# Patient Record
Sex: Male | Born: 1985 | Race: Black or African American | Hispanic: No | Marital: Single | Smoking: Former smoker
Health system: Southern US, Community
[De-identification: ages and names within clinical notes are randomized; demographics above are authoritative.]

## PROBLEM LIST (undated history)

## (undated) DIAGNOSIS — G459 Transient cerebral ischemic attack, unspecified: Secondary | ICD-10-CM

## (undated) DIAGNOSIS — I456 Pre-excitation syndrome: Secondary | ICD-10-CM

## (undated) DIAGNOSIS — I480 Paroxysmal atrial fibrillation: Secondary | ICD-10-CM

## (undated) HISTORY — DX: Pre-excitation syndrome: I45.6

## (undated) HISTORY — PX: CARDIAC SURGERY: SHX584

## (undated) HISTORY — DX: Paroxysmal atrial fibrillation: I48.0

## (undated) HISTORY — DX: Transient cerebral ischemic attack, unspecified: G45.9

---
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
## 2006-08-28 ENCOUNTER — Emergency Department (HOSPITAL_COMMUNITY): Admission: EM | Admit: 2006-08-28 | Discharge: 2006-08-28 | Payer: Self-pay | Admitting: Emergency Medicine

## 2006-09-10 ENCOUNTER — Emergency Department (HOSPITAL_COMMUNITY): Admission: EM | Admit: 2006-09-10 | Discharge: 2006-09-10 | Payer: Self-pay | Admitting: Emergency Medicine

## 2007-09-25 ENCOUNTER — Emergency Department (HOSPITAL_COMMUNITY): Admission: EM | Admit: 2007-09-25 | Discharge: 2007-09-25 | Payer: Self-pay | Admitting: Emergency Medicine

## 2008-03-31 ENCOUNTER — Emergency Department (HOSPITAL_COMMUNITY): Admission: EM | Admit: 2008-03-31 | Discharge: 2008-04-01 | Payer: Self-pay | Admitting: Emergency Medicine

## 2008-07-24 HISTORY — PX: CARDIAC SURGERY: SHX584

## 2009-03-10 ENCOUNTER — Emergency Department (HOSPITAL_COMMUNITY): Admission: EM | Admit: 2009-03-10 | Discharge: 2009-03-10 | Payer: Self-pay | Admitting: Emergency Medicine

## 2009-05-19 ENCOUNTER — Emergency Department (HOSPITAL_COMMUNITY): Admission: EM | Admit: 2009-05-19 | Discharge: 2009-05-20 | Payer: Self-pay | Admitting: Emergency Medicine

## 2009-08-17 ENCOUNTER — Encounter: Payer: Self-pay | Admitting: Internal Medicine

## 2009-08-18 ENCOUNTER — Encounter (INDEPENDENT_AMBULATORY_CARE_PROVIDER_SITE_OTHER): Payer: Self-pay | Admitting: Cardiovascular Disease

## 2009-08-18 ENCOUNTER — Ambulatory Visit: Payer: Self-pay | Admitting: Internal Medicine

## 2009-08-18 ENCOUNTER — Inpatient Hospital Stay (HOSPITAL_COMMUNITY): Admission: EM | Admit: 2009-08-18 | Discharge: 2009-08-24 | Payer: Self-pay | Admitting: Emergency Medicine

## 2009-08-19 ENCOUNTER — Encounter: Payer: Self-pay | Admitting: Internal Medicine

## 2009-09-07 ENCOUNTER — Ambulatory Visit: Payer: Self-pay | Admitting: Internal Medicine

## 2009-09-07 DIAGNOSIS — I059 Rheumatic mitral valve disease, unspecified: Secondary | ICD-10-CM | POA: Insufficient documentation

## 2009-09-07 DIAGNOSIS — I4891 Unspecified atrial fibrillation: Secondary | ICD-10-CM | POA: Insufficient documentation

## 2009-09-08 ENCOUNTER — Emergency Department (HOSPITAL_COMMUNITY): Admission: EM | Admit: 2009-09-08 | Discharge: 2009-09-09 | Payer: Self-pay | Admitting: Emergency Medicine

## 2009-09-17 ENCOUNTER — Telehealth: Payer: Self-pay | Admitting: Internal Medicine

## 2009-09-22 ENCOUNTER — Telehealth (INDEPENDENT_AMBULATORY_CARE_PROVIDER_SITE_OTHER): Payer: Self-pay | Admitting: *Deleted

## 2009-09-27 ENCOUNTER — Telehealth (INDEPENDENT_AMBULATORY_CARE_PROVIDER_SITE_OTHER): Payer: Self-pay | Admitting: *Deleted

## 2009-09-29 ENCOUNTER — Telehealth: Payer: Self-pay | Admitting: Internal Medicine

## 2009-12-01 ENCOUNTER — Encounter (INDEPENDENT_AMBULATORY_CARE_PROVIDER_SITE_OTHER): Payer: Self-pay | Admitting: *Deleted

## 2010-01-10 ENCOUNTER — Telehealth: Payer: Self-pay | Admitting: Internal Medicine

## 2010-02-03 ENCOUNTER — Ambulatory Visit: Payer: Self-pay | Admitting: Internal Medicine

## 2010-03-15 ENCOUNTER — Telehealth: Payer: Self-pay | Admitting: Internal Medicine

## 2010-08-23 NOTE — Progress Notes (Signed)
Summary: mon wants pt to have pacemaker  Phone Note Call from Patient   Caller: Mom [name] [phone] Reason for Call: Talk to Nurse Summary of Call: mom would like dr Graciela Husbands to set up pacemaker placement-[facility] denied him due to not having insurance-med too expensive Initial call taken by: Glynda Jaeger,  January 10, 2010 12:56 PM  Follow-up for Phone Call        Spoke with pt's mother. Mother states pt. was scheduled to have surgery in [place]. Pt's insurance would not cover the surgery . Mother also said the medication were very expensive. Pt's mother would like to make an appointment in Dr. Marikay Alar clinic to discuss pacemaker placement . An appointment was made for pt. on July 14th at 2:45 Pm pt's mother aware. Follow-up by: Ollen Gross, RN, BSN,  January 10, 2010 1:19 PM

## 2010-08-23 NOTE — Progress Notes (Signed)
Summary: regarding pacemaker  Phone Note Call from Patient Call back at Home Phone [phone]   Caller: Mom- saundra Reason for Call: Talk to Nurse Details for Reason: Per pt mom calling, Has Dr. Graciela Husbands agree to do the pacermaker , [facility] has refused b/c insurance reason.  Initial call taken by: Lorne Skeens,  September 29, 2009 10:49 AM  Follow-up for Phone Call        S/W Osvaldo Human and had the same conversation with her as I had with her son yesterday. She understands and is grateful Follow-up by: Duncan Dull, RN, BSN,  September 29, 2009 12:11 PM

## 2010-08-23 NOTE — Progress Notes (Signed)
Summary:  Referral to Dr. Macon Large  Phone Note Other Incoming   Caller: Marlene Lard- Financial Counselor for [facility] Summary of Call: She received a call from Mr. Kroening mother (Mrs. Clydene Pugh) stating the Dr. Macon Large will not see the pt because he is self pay unless he paid upfront which is $450.00. The only thing that she can recommend is maybe having them apply for Medicaid if there is a possibilty that he will be disabled for 12months. I will pass this message on to Dr. Graciela Husbands. She would like for him to s/w the mother to let her know if he has any other suggestions. She is aware that he is out of the office until tomorrow.  Initial call taken by: Duncan Dull, RN, BSN,  September 27, 2009 1:46 PM  Follow-up for Phone Call        Per Dr. Graciela Husbands we will refer him to Dr. Prentice Docker at [facility]- [phone]. I s/w Diane and faxed all of the pts information including a demographic sheet to her. She said they do take self pay pt's. S/W the pt and gave him the information. Attempted to s/w Mother and she was not home. Pt is aware that she can call with questions. Dr. Metro Kung office will contact the pt for an appt.  Follow-up by: Duncan Dull, RN, BSN,  September 28, 2009 3:06 PM

## 2010-08-23 NOTE — Letter (Signed)
Summary: Work Writer, Main Office  [address]   [place], [place] [address]   Phone: [phone]  Fax: [phone]     February 03, 2010    Robert Koch   The above named patient had a medical visit today  Please take this into consideration when reviewing the time away from work/school.      Sincerely yours,  Architectural technologist

## 2010-08-23 NOTE — Progress Notes (Signed)
Summary: appt in [facility]/Mom call to check on appt  Phone Note Call from Patient Call back at Home Phone [phone]   Caller: Mom Reason for Call: Talk to Nurse Summary of Call: wants to know if appt has been made for [facility] Initial call taken by: Migdalia Dk,  September 17, 2009 3:40 PM  Follow-up for Phone Call        Spoke with pt. She would like to know about the status of an appointment that supposed to be made for her at [facility]. I let pt. know  Md and his nurse are not in office today. I will send message to their desktop. Melonie MD's nurse will be in the office on monday 09/20/09. Okay with pt. Ollen Gross, RN, BSN  September 17, 2009 3:54 PM  [phone] Mom calling to f/u on her son's appt to [facility] for heart surgery. She is at the hospital right now with her mother and the number listed above is her brothers number. Please call with appt at the number listed above. Omer Jack  September 20, 2009 11:46 AM    per pt mom calling back to speak with melaine, will not be at home, going to hospital but will call back Lorne Skeens  September 21, 2009 9:18 AM    Additional Follow-up for Phone Call Additional follow up Details #1::        I called and s/w the pt's uncle as requested by pt's mother. I let him know that I was ask by Dr. Graciela Husbands to send a demographic sheet and last OV note to [facility], which I did but normally [facility] and the pt make the arrangements from that point. I also told him that I would certainly s/w Dr. Graciela Husbands tomorrow to make sure that he wasn't  planning to make other arrangements that I may not be aware of. I told him she can call me back if she needs to but I would let her know more after s/w Dr. Graciela Husbands.  Additional Follow-up by: Duncan Dull, RN, BSN,  September 20, 2009 2:01 PM    Additional Follow-up for Phone Call Additional follow up Details #2::    mother calling back, request call [phone], Migdalia Dk  September 20, 2009 4:49 PM   S/W Olegario Messier at Dr.  Mont Dutton office and I am sent her a demographics sheet so that she can get the pt scheduled. Olegario Messier will be contacting them.  Pts mother is a pt here as well her name is Rosana Hoes and she is aware of the plan.  Follow-up by: Duncan Dull, RN, BSN,  September 22, 2009 2:49 PM

## 2010-08-23 NOTE — Progress Notes (Signed)
Summary: Calling regarding surgery  Phone Note Call from Patient Call back at [phone]   Caller: Mom/Sandra Summary of Call: Pt mother calling about the pt surgery Initial call taken by: Judie Grieve,  March 15, 2010 10:38 AM  Follow-up for Phone Call        the pt was started on propafenone 225 two times a day i will ask the team out [facility] about cryo ablation Follow-up by: Nathen May, MD, [facility],  March 15, 2010 5:59 PM

## 2010-08-23 NOTE — Letter (Signed)
Summary: Appointment - Missed   [facility], Main Office  [address]   [place], [place] [address]   Phone: [phone]  Fax: [phone]     Dec 01, 2009 MRN: [id number]   Jden Palmatier [address] [place], [place]  [address]   Dear Mr. Wendling,  Our records indicate you missed your appointment on 5.4 with Dr Graciela Husbands. It is very important that we reach you to reschedule this appointment. We look forward to participating in your health care needs. Please contact us at the number listed above at your earliest convenience to reschedule this appointment.     Sincerely,   Ruel Favors Scheduling Team

## 2010-08-23 NOTE — Progress Notes (Signed)
  Faxed all Cardiac over to [facility] w/ [facility] to fax [phone] [facility]  September 22, 2009 3:38 PM

## 2010-08-23 NOTE — Assessment & Plan Note (Signed)
Summary: appt @ 4:15/eph   CC:  eph/pt reports palpitations with occasional chest pain and SOB.  It sometimes wakes him up at night.  He has a hx of atrial fibrillation.[name]  History of Present Illness: Robert Koch is seen in followup for atrial fibrillation. He underwent a physiologic testing given his young age. 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 Flexion and therapy was recommended. He has had at least one breakthrough on flecainide 50 mg twice daily he is tolerating the medications without difficulty.  hospital echo demonstrated normal left ventricular function with mitral valve prolapse  Current Medications (verified): 1)  Flecainide Acetate 50 Mg Tabs (Flecainide Acetate) .... Take One Tablet Two Times A Day 2)  Aspirin 325 Mg Tabs (Aspirin) .... Take One Tablet Weekly As Needed.  Not Taking  Allergies (verified): 1)  ! Amoxicillin  Vital Signs:  Patient profile:   25 year old male Height:      60 inches Weight:      135 pounds BMI:     26.46 Pulse rate:   82 / minute Pulse rhythm:   irregular BP sitting:   90 / 58  (left arm) Cuff size:   large  Vitals Entered By: Judithe Modest CMA (September 07, 2009 4:04 PM)  Physical Exam  General:  The patient was alert and oriented in no acute distress. HEENT Normal.  Neck veins were flat, carotids were brisk.  Lungs were clear.  Heart sounds were regular without murmurs or gallops.  Abdomen was soft with active bowel sounds. There is no clubbing cyanosis or edema. Skin Warm and dry    EKG  Procedure date:  09/07/2009  Findings:      sinus rhythm at 82 with intervals of 0.10/0.08/0.37 axis was 54 No evidence of ventricular  Impression & Recommendations:  Problem # 1:  ATRIAL FIBRILLATION (ICD-427.31)  He has had recurrent atrial fibrillation ; we will increase his flecainide from 50-100 mg twice daily.  In addition I've been in contact with [facility] to consider catheter ablation using cryotherapy  to try to avoid the possibility of inadvertent heart block. I await hearing from them. His updated medication list for this problem includes:    Flecainide Acetate 50 Mg Tabs (Flecainide acetate) .[name]... Take one tablet two times a day    Aspirin 325 Mg Tabs (Aspirin) .[name]... Take one tablet weekly as needed.  not taking  His updated medication list for this problem includes:    Flecainide Acetate 100 Mg Tabs (Flecainide acetate) .[name]... Take one tablet by mouth every 12 hours    Aspirin 325 Mg Tabs (Aspirin) .[name]... Take one tablet weekly as needed.  not taking  His updated medication list for this problem includes:    Flecainide Acetate 100 Mg Tabs (Flecainide acetate) .[name]... Take one tablet by mouth every 12 hours    Aspirin 325 Mg Tabs (Aspirin) .[name]... Take one tablet weekly as needed.  not taking  Problem # 2:  MITRAL VALVE PROLAPSE (ICD-424.0) hwill need long-term followup  Other Orders: EKG w/ Interpretation (93000)  Patient Instructions: 1)  Your physician recommends that you schedule a follow-up appointment in: 3 MONTHS 2)  Your physician has recommended you make the following change in your medication: INCREASE FLECANIDE 100MG  TWICE DAILY Prescriptions: FLECAINIDE ACETATE 100 MG TABS (FLECAINIDE ACETATE) Take one tablet by mouth every 12 hours  #60 x 12   Entered by:   Deliah Goody, RN   Authorized by:   Nathen May,  MD, [facility]   Signed by:   Deliah Goody, RN on 09/07/2009   Method used:   Electronically to        [facility] [phone]* (retail)       [address]       [place], [place]  [address]       Ph: [phone] or [phone]       Fax: [phone]   RxID:   [id number]

## 2010-08-23 NOTE — Assessment & Plan Note (Signed)
Summary: rov/discuss pacemaker placement/per nividia/jml   CC:  Pt here to discuss device.  He was turned down at [facility] for device.  He has been having problems with chest discomfort and irregular beats.  He has not been taking Flecainide of ASA due to not being able to afford the flecainide.  [name]  History of Present Illness: MMiller is seen in followup for atrial fibrillation. He underwentelectro  physiologic testing given his young age. 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 Flecanide therapy was recommended. He has had at least one breakthrough on flecainide 50 mg twice dailybut then stopped taking it becasue he thoguth it wasnt workihg   has episodes of palpitations that last 10-15 seconds. There are no longer episodes.  He has talked to the doctors at [facility] and his mother has talked to the program at [facility]. Apparently neither hospital is willing to see him for his ablation because of lack of insurance.  hospital echo demonstrated normal left ventricular function with mitral valve prolapse  Current Medications (verified): 1)  None  Allergies (verified): 1)  ! Amoxicillin  Past History:  Past Medical History: Last updated: 11/24/2009 Paroxysmal atrial fibrillation History of recurrent tachy palpitations, both regular and       irregular Left ventricular hypertrophy by echo History of an enlarged heart  Family History: Last updated: 11/24/2009 Notable for his mother with atrial fibrillation.  Apparently, his father is healthy.   Social History: Last updated: 11/24/2009 He is single He smokes and uses alcohol occasionally.   Denies use of recreational drugs.   He does use caffeine.   Vital Signs:  Patient profile:   25 year old male Height:      60 inches Weight:      248 pounds BMI:     48.61 Pulse rate:   69 / minute Pulse rhythm:   regular BP sitting:   110 / 70  (right arm) Cuff size:   regular  Vitals Entered By: Judithe Modest CMA (February 03, 2010 2:59 PM)  Physical Exam  General:  The patient was alert and oriented in no acute distress. HEENT Normal.  Neck veins were flat, carotids were brisk.  Lungs were clear.  Heart sounds were regular without murmurs or gallops.  Abdomen was soft with active bowel sounds. There is no clubbing cyanosis or edema. 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 He has not been able to get ablation undertaken a [facility] or [facility] because of insurance issues. We'll put him on her path and on. To 25 twice daily. He has breakthroughs, I will consider undertaking procedure here with the realization that there may be increased risk related to RF energy versus cryo energy   The following medications were removed from the medication list:    Flecainide Acetate 100 Mg Tabs (Flecainide acetate) .[name]... Take one tablet by mouth every 12 hours    Aspirin 325 Mg Tabs (Aspirin) .[name]... Take one tablet weekly as needed.  not taking  Other Orders: EKG w/ Interpretation (93000)  Patient Instructions: 1)  Your physician has recommended you make the following change in your medication: Start Propafenone 225mg  1 tablet twice daily. Prescriptions: PROPAFENONE HCL 225 MG TABS (PROPAFENONE HCL) Take one tablet  by mouth every 12 hours  #60 x 11   Entered by:   Optometrist BSN   Authorized by:   Nathen May, MD, [facility]   Signed by:   Gypsy Balsam RN BSN on 02/03/2010   Method used:   Electronically to        [facility] [phone]* (retail)       [address]       [place], [place]  [address]       Ph: [phone]       Fax: [phone]   RxID:   [id number]

## 2010-10-05 ENCOUNTER — Emergency Department (HOSPITAL_COMMUNITY)
Admission: EM | Admit: 2010-10-05 | Discharge: 2010-10-05 | Disposition: A | Discharge status: Home / Self Care | Payer: Self-pay | Attending: Emergency Medicine | Admitting: Emergency Medicine

## 2010-10-05 DIAGNOSIS — M545 Low back pain, unspecified: Secondary | ICD-10-CM | POA: Insufficient documentation

## 2010-10-05 DIAGNOSIS — M546 Pain in thoracic spine: Secondary | ICD-10-CM | POA: Insufficient documentation

## 2010-10-05 DIAGNOSIS — S335XXA Sprain of ligaments of lumbar spine, initial encounter: Secondary | ICD-10-CM | POA: Insufficient documentation

## 2010-10-05 DIAGNOSIS — X58XXXA Exposure to other specified factors, initial encounter: Secondary | ICD-10-CM | POA: Insufficient documentation

## 2010-10-05 DIAGNOSIS — J45909 Unspecified asthma, uncomplicated: Secondary | ICD-10-CM | POA: Insufficient documentation

## 2010-10-09 LAB — BASIC METABOLIC PANEL
Calcium: 8.9 mg/dL (ref 8.4–10.5)
GFR calc Af Amer: >60 mL/min (ref >60)
GFR calc non Af Amer: >60 mL/min (ref >60)
Glucose, Bld: 97 mg/dL (ref 70–99)
Sodium: 138 mEq/L (ref 135–145)

## 2010-10-09 LAB — COMPREHENSIVE METABOLIC PANEL WITH GFR
ALT: 15 U/L (ref 0–53)
AST: 21 U/L (ref 0–37)
Albumin: 3.8 g/dL (ref 3.5–5.2)
Alkaline Phosphatase: 108 U/L (ref 39–117)
BUN: 28 mg/dL — ABNORMAL HIGH (ref 6–23)
CO2: 26 meq/L (ref 19–32)
Calcium: 9.6 mg/dL (ref 8.4–10.5)
Chloride: 105 meq/L (ref 96–112)
Creatinine, Ser: 1.11 mg/dL (ref 0.4–1.5)
GFR calc non Af Amer: >60 mL/min
Glucose, Bld: 82 mg/dL (ref 70–99)
Potassium: 3.9 meq/L (ref 3.5–5.1)
Sodium: 138 meq/L (ref 135–145)
Total Bilirubin: 0.5 mg/dL (ref 0.3–1.2)
Total Protein: 7.6 g/dL (ref 6.0–8.3)

## 2010-10-09 LAB — DIFFERENTIAL
Basophils Absolute: 0 10*3/uL (ref 0.0–0.1)
Basophils Relative: 0 % (ref 0–1)
Eosinophils Absolute: 0.2 10*3/uL (ref 0.0–0.7)
Eosinophils Relative: 4 % (ref 0–5)
Lymphocytes Relative: 33 % (ref 12–46)
Lymphs Abs: 1.9 10*3/uL (ref 0.7–4.0)
Monocytes Absolute: 0.6 10*3/uL (ref 0.1–1.0)
Monocytes Relative: 11 % (ref 3–12)
Neutro Abs: 2.9 10*3/uL (ref 1.7–7.7)
Neutrophils Relative %: 51 % (ref 43–77)

## 2010-10-09 LAB — CK TOTAL AND CKMB (NOT AT ARMC): CK, MB: 1.5 ng/mL (ref 0.3–4.0)

## 2010-10-09 LAB — CARDIAC PANEL(CRET KIN+CKTOT+MB+TROPI)
CK, MB: 0.9 ng/mL (ref 0.3–4.0)
CK, MB: 1.2 ng/mL (ref 0.3–4.0)
Total CK: 127 U/L (ref 7–232)
Troponin I: 0.01 ng/mL (ref 0.00–0.06)

## 2010-10-09 LAB — CBC
HCT: 39.9 % (ref 39.0–52.0)
HCT: 44.1 % (ref 39.0–52.0)
Hemoglobin: 12.7 g/dL — ABNORMAL LOW (ref 13.0–17.0)
Hemoglobin: 14.6 g/dL (ref 13.0–17.0)
MCHC: 33.9 g/dL (ref 30.0–36.0)
MCV: 83.9 fL (ref 78.0–100.0)
Platelets: 145 10*3/uL — ABNORMAL LOW (ref 150–400)
Platelets: 165 10*3/uL (ref 150–400)
RBC: 4.76 MIL/uL (ref 4.22–5.81)
RDW: 12.9 % (ref 11.5–15.5)
RDW: 13.5 % (ref 11.5–15.5)
WBC: 4.9 10*3/uL (ref 4.0–10.5)

## 2010-10-09 LAB — MAGNESIUM: Magnesium: 2.2 mg/dL (ref 1.5–2.5)

## 2010-10-09 LAB — BRAIN NATRIURETIC PEPTIDE: Pro B Natriuretic peptide (BNP): <30 pg/mL (ref 0.0–100.0)

## 2010-10-09 LAB — HEMOGLOBIN A1C
Hgb A1c MFr Bld: 5.4 % (ref 4.6–6.1)
Mean Plasma Glucose: 108 mg/dL

## 2010-10-09 LAB — PROTIME-INR
INR: 1.12 (ref 0.00–1.49)
Prothrombin Time: 13 seconds (ref 11.6–15.2)

## 2010-10-09 LAB — DRUGS OF ABUSE SCREEN W/O ALC, ROUTINE URINE
Benzodiazepines.: NEGATIVE
Creatinine,U: 70 mg/dL
Marijuana Metabolite: NEGATIVE
Phencyclidine (PCP): NEGATIVE
Propoxyphene: NEGATIVE

## 2010-10-09 LAB — POCT CARDIAC MARKERS: Troponin i, poc: <0.05 ng/mL (ref 0.00–0.09)

## 2010-10-09 LAB — ETHANOL: Alcohol, Ethyl (B): <5 mg/dL (ref 0–10)

## 2010-10-09 LAB — TSH: TSH: 1.216 u[IU]/mL (ref 0.350–4.500)

## 2010-10-09 LAB — TROPONIN I: Troponin I: 0.02 ng/mL (ref 0.00–0.06)

## 2010-10-09 LAB — APTT: aPTT: 30 seconds (ref 24–37)

## 2010-10-29 LAB — POCT I-STAT, CHEM 8
BUN: 13 mg/dL (ref 6–23)
Calcium, Ion: 1.14 mmol/L (ref 1.12–1.32)
Chloride: 105 mEq/L (ref 96–112)
Creatinine, Ser: 1.1 mg/dL (ref 0.4–1.5)
Glucose, Bld: 88 mg/dL (ref 70–99)

## 2010-10-29 LAB — CBC
Hemoglobin: 14.5 g/dL (ref 13.0–17.0)
MCHC: 33.6 g/dL (ref 30.0–36.0)
RBC: 5.19 MIL/uL (ref 4.22–5.81)

## 2010-10-29 LAB — DIFFERENTIAL
Lymphocytes Relative: 26 % (ref 12–46)
Lymphs Abs: 1.1 10*3/uL (ref 0.7–4.0)
Monocytes Absolute: 0.4 10*3/uL (ref 0.1–1.0)
Monocytes Relative: 10 % (ref 3–12)
Neutro Abs: 2.7 10*3/uL (ref 1.7–7.7)

## 2010-10-29 LAB — POCT CARDIAC MARKERS: Troponin i, poc: <0.05 ng/mL (ref 0.00–0.09)

## 2010-12-12 ENCOUNTER — Emergency Department (HOSPITAL_COMMUNITY)
Admission: EM | Admit: 2010-12-12 | Discharge: 2010-12-13 | Disposition: A | Discharge status: Home / Self Care | Payer: Self-pay | Attending: Emergency Medicine | Admitting: Emergency Medicine

## 2010-12-12 DIAGNOSIS — R109 Unspecified abdominal pain: Secondary | ICD-10-CM | POA: Insufficient documentation

## 2010-12-12 DIAGNOSIS — R112 Nausea with vomiting, unspecified: Secondary | ICD-10-CM | POA: Insufficient documentation

## 2010-12-13 LAB — COMPREHENSIVE METABOLIC PANEL
ALT: 14 U/L (ref 0–53)
Albumin: 4.1 g/dL (ref 3.5–5.2)
Alkaline Phosphatase: 65 U/L (ref 39–117)
BUN: 13 mg/dL (ref 6–23)
Chloride: 93 mEq/L — ABNORMAL LOW (ref 96–112)
Glucose, Bld: 136 mg/dL — ABNORMAL HIGH (ref 70–99)
Potassium: 3.6 mEq/L (ref 3.5–5.1)
Sodium: 131 mEq/L — ABNORMAL LOW (ref 135–145)
Total Bilirubin: 0.3 mg/dL (ref 0.3–1.2)

## 2010-12-13 LAB — URINALYSIS, ROUTINE W REFLEX MICROSCOPIC
Bilirubin Urine: NEGATIVE
Glucose, UA: NEGATIVE mg/dL
Ketones, ur: 40 mg/dL — AB
Protein, ur: NEGATIVE mg/dL

## 2010-12-13 LAB — CBC
HCT: 43.5 % (ref 39.0–52.0)
MCV: 80.1 fL (ref 78.0–100.0)
Platelets: 205 10*3/uL (ref 150–400)
RBC: 5.43 MIL/uL (ref 4.22–5.81)
WBC: 6.1 10*3/uL (ref 4.0–10.5)

## 2010-12-13 LAB — DIFFERENTIAL
Basophils Absolute: 0 10*3/uL (ref 0.0–0.1)
Lymphocytes Relative: 18 % (ref 12–46)
Lymphs Abs: 1.1 10*3/uL (ref 0.7–4.0)
Neutrophils Relative %: 69 % (ref 43–77)

## 2011-04-26 LAB — POCT I-STAT, CHEM 8
BUN: 18
Calcium, Ion: 1.24
Chloride: 102
Creatinine, Ser: 1.2
Glucose, Bld: 85
HCT: 41
Potassium: 3.7

## 2011-04-26 LAB — POCT CARDIAC MARKERS: Troponin i, poc: <0.05

## 2011-07-31 IMAGING — CR DG CHEST 1V PORT
1 series · 1 of 1 positions shown · non-contrast
Comparison: 03/10/2009

CLINICAL DATA: Tachycardia.

CHEST - 1 VIEW

[AP]
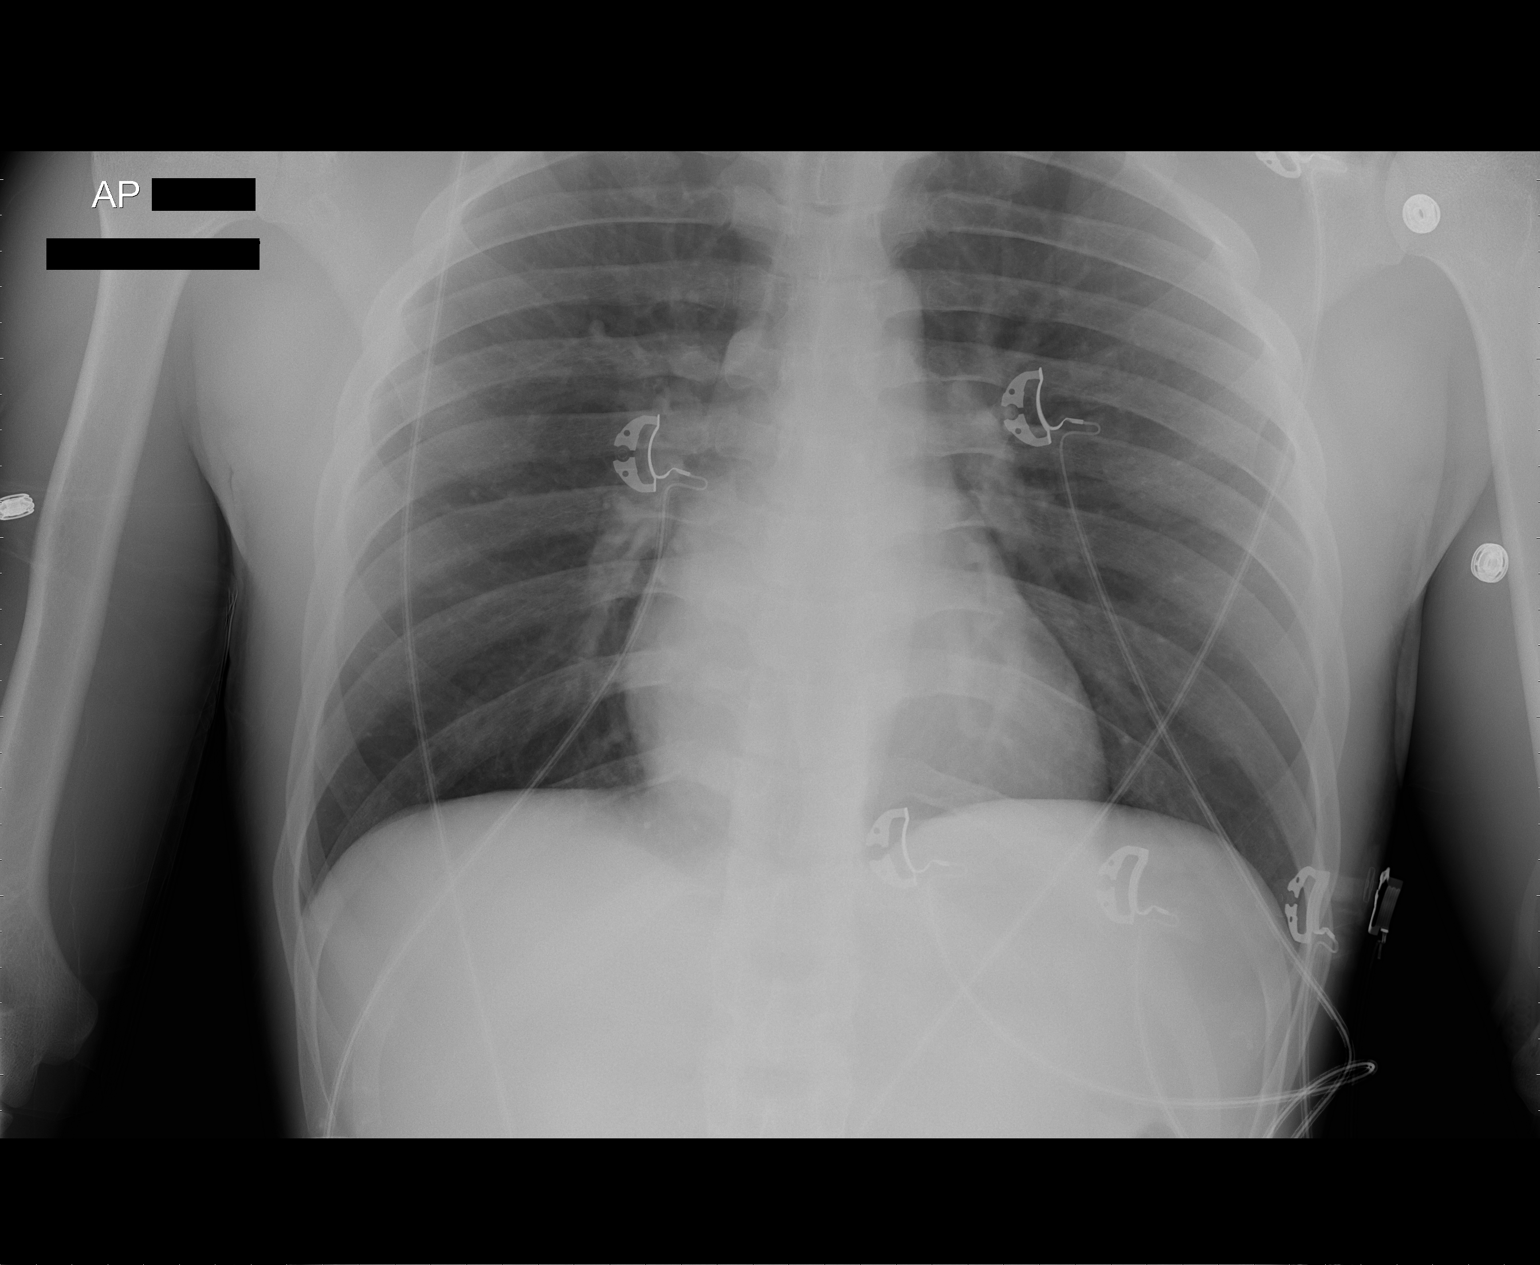

[1 of 1 positions shown; findings below may reference images not displayed]

FINDINGS: The heart size and mediastinal contours are within normal
limits.  Both lungs are clear.
IMPRESSION: No active disease.

## 2012-05-12 ENCOUNTER — Encounter (HOSPITAL_COMMUNITY): Payer: Self-pay | Admitting: *Deleted

## 2012-05-12 ENCOUNTER — Emergency Department (HOSPITAL_COMMUNITY): Payer: No Typology Code available for payment source

## 2012-05-12 ENCOUNTER — Emergency Department (HOSPITAL_COMMUNITY)
Admission: EM | Admit: 2012-05-12 | Discharge: 2012-05-12 | Disposition: A | Discharge status: Home / Self Care | Payer: No Typology Code available for payment source | Attending: Emergency Medicine | Admitting: Emergency Medicine

## 2012-05-12 DIAGNOSIS — S139XXA Sprain of joints and ligaments of unspecified parts of neck, initial encounter: Secondary | ICD-10-CM | POA: Insufficient documentation

## 2012-05-12 DIAGNOSIS — Y92410 Unspecified street and highway as the place of occurrence of the external cause: Secondary | ICD-10-CM | POA: Insufficient documentation

## 2012-05-12 DIAGNOSIS — S161XXA Strain of muscle, fascia and tendon at neck level, initial encounter: Secondary | ICD-10-CM

## 2012-05-12 MED ORDER — HYDROCODONE-ACETAMINOPHEN 5-325 MG PO TABS: 1.0000 | ORAL_TABLET | Freq: Four times a day (QID) | ORAL | Status: DC | PRN

## 2012-05-12 MED ORDER — IBUPROFEN 800 MG PO TABS: 800.0000 mg | ORAL_TABLET | Freq: Three times a day (TID) | ORAL | Status: DC | PRN

## 2012-05-12 NOTE — ED Notes (Signed)
Patient transported to X-ray 
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
## 2012-05-17 NOTE — ED Provider Notes (Signed)
Medical screening examination/treatment/procedure(s) were performed by non-physician practitioner and as supervising physician I was immediately available for consultation/collaboration.   Keron Neenan M Tameah Mihalko, MD 05/17/12 2122 

## 2013-03-14 ENCOUNTER — Emergency Department (HOSPITAL_COMMUNITY): Payer: Self-pay

## 2013-03-14 ENCOUNTER — Encounter (HOSPITAL_COMMUNITY): Payer: Self-pay

## 2013-03-14 ENCOUNTER — Emergency Department (HOSPITAL_COMMUNITY)
Admission: EM | Admit: 2013-03-14 | Discharge: 2013-03-14 | Disposition: A | Discharge status: Home / Self Care | Payer: Self-pay | Attending: Emergency Medicine | Admitting: Emergency Medicine

## 2013-03-14 DIAGNOSIS — M549 Dorsalgia, unspecified: Secondary | ICD-10-CM

## 2013-03-14 DIAGNOSIS — R079 Chest pain, unspecified: Secondary | ICD-10-CM | POA: Insufficient documentation

## 2013-03-14 DIAGNOSIS — K089 Disorder of teeth and supporting structures, unspecified: Secondary | ICD-10-CM | POA: Insufficient documentation

## 2013-03-14 DIAGNOSIS — G8918 Other acute postprocedural pain: Secondary | ICD-10-CM | POA: Insufficient documentation

## 2013-03-14 DIAGNOSIS — T85848A Pain due to other internal prosthetic devices, implants and grafts, initial encounter: Secondary | ICD-10-CM

## 2013-03-14 DIAGNOSIS — Z87891 Personal history of nicotine dependence: Secondary | ICD-10-CM | POA: Insufficient documentation

## 2013-03-14 DIAGNOSIS — Z880 Allergy status to penicillin: Secondary | ICD-10-CM | POA: Insufficient documentation

## 2013-03-14 DIAGNOSIS — R4789 Other speech disturbances: Secondary | ICD-10-CM | POA: Insufficient documentation

## 2013-03-14 DIAGNOSIS — M545 Low back pain, unspecified: Secondary | ICD-10-CM | POA: Insufficient documentation

## 2013-03-14 DIAGNOSIS — F1010 Alcohol abuse, uncomplicated: Secondary | ICD-10-CM | POA: Insufficient documentation

## 2013-03-14 DIAGNOSIS — F10929 Alcohol use, unspecified with intoxication, unspecified: Secondary | ICD-10-CM

## 2013-03-14 DIAGNOSIS — Z9889 Other specified postprocedural states: Secondary | ICD-10-CM | POA: Insufficient documentation

## 2013-03-14 LAB — CBC WITH DIFFERENTIAL/PLATELET
Basophils Absolute: 0 10*3/uL (ref 0.0–0.1)
Basophils Relative: 0 % (ref 0–1)
HCT: 41.1 % (ref 39.0–52.0)
Hemoglobin: 14 g/dL (ref 13.0–17.0)
Lymphocytes Relative: 34 % (ref 12–46)
MCHC: 34.1 g/dL (ref 30.0–36.0)
Monocytes Absolute: 0.8 10*3/uL (ref 0.1–1.0)
Monocytes Relative: 13 % — ABNORMAL HIGH (ref 3–12)
Neutro Abs: 3 10*3/uL (ref 1.7–7.7)
Neutrophils Relative %: 51 % (ref 43–77)
WBC: 5.9 10*3/uL (ref 4.0–10.5)

## 2013-03-14 LAB — COMPREHENSIVE METABOLIC PANEL
AST: 21 U/L (ref 0–37)
Albumin: 3.6 g/dL (ref 3.5–5.2)
Alkaline Phosphatase: 69 U/L (ref 39–117)
BUN: 12 mg/dL (ref 6–23)
CO2: 21 mEq/L (ref 19–32)
Chloride: 103 mEq/L (ref 96–112)
Creatinine, Ser: 0.89 mg/dL (ref 0.50–1.35)
GFR calc non Af Amer: >90 mL/min (ref >90)
Potassium: 3.7 mEq/L (ref 3.5–5.1)
Total Bilirubin: 0.1 mg/dL — ABNORMAL LOW (ref 0.3–1.2)

## 2013-03-14 LAB — ETHANOL: Alcohol, Ethyl (B): 214 mg/dL — ABNORMAL HIGH (ref 0–11)

## 2013-03-14 LAB — URINALYSIS, ROUTINE W REFLEX MICROSCOPIC
Glucose, UA: NEGATIVE mg/dL
Leukocytes, UA: NEGATIVE
Nitrite: NEGATIVE
Specific Gravity, Urine: 1.014 (ref 1.005–1.030)
pH: 5.5 (ref 5.0–8.0)

## 2013-03-14 LAB — RAPID URINE DRUG SCREEN, HOSP PERFORMED
Amphetamines: NOT DETECTED
Tetrahydrocannabinol: NOT DETECTED

## 2013-03-14 LAB — TROPONIN I: Troponin I: <0.3 ng/mL (ref <0.30)

## 2013-03-14 MED ORDER — SODIUM CHLORIDE 0.9 % IV BOLUS (SEPSIS)
1000.0000 mL | Freq: Once | INTRAVENOUS | Status: AC
  Administered 2013-03-14: 1000 mL via INTRAVENOUS

## 2013-03-14 MED ORDER — NAPROXEN 500 MG PO TABS: 500.0000 mg | ORAL_TABLET | Freq: Two times a day (BID) | ORAL | Status: DC

## 2013-03-14 NOTE — ED Notes (Signed)
Unable to get Ekg PA at patient's bedside

## 2013-03-14 NOTE — ED Notes (Signed)
Bed: AV40 Expected date:  Expected time:  Means of arrival:  Comments: Hold for Room 5

## 2013-03-14 NOTE — ED Notes (Signed)
sleeping

## 2013-03-14 NOTE — ED Provider Notes (Signed)
CSN: [id number]     Arrival date & time 03/14/13  4098 History     First MD Initiated Contact with Patient 03/14/13 0421     Chief Complaint  Patient presents with  . Back Pain  . Dental Pain   (Consider location/radiation/quality/duration/timing/severity/associated sxs/prior Treatment) The history is provided by the patient. No language interpreter was used.  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 Patient reported that the lower back pain started tonight, described as a gripping sensation that is constant without radiation. Patient reported that the back pain was sudden onset that occurred tonight, reported that it affects both sides. Patient reported that he noticed back pain once going into bed, reported that he is concerned that it is his kidneys. Patient reported that he has been having ongoing tooth ache for the past 2 days, localized to the second right molar of the mandible jaw-patient reporting that he has a "hole" in his tooth. Patient described the discomfort to be a constant throbbing sensation with radiation to the right ear. Patient reported that he's been using Advil PM, Aleve, Tylenol and BC powder without any relief. Reported that nothing makes the back pain better or worse. Patient reported that approximately 2 days ago he was experiencing chest pain, localized to the center of the chest, described as a burning sensation. Patient reported that he was drinking alcohol today, reported that he drank two 40 ounce beers. Denied fall, injuries, urinary and bowel incontinence, neck pain, fever, chills, nausea, vomiting, diarrhea, shortness of breath, difficulty breathing, numbness, tingling. PCP none  History reviewed. No pertinent past medical history. Past Surgical History  Procedure Laterality  Date  . 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 No pertinent family history. History  Substance Use Topics  . Smoking status: Former Smoker -- 0.10 packs/day for 1 years    Types: Cigarettes  . Smokeless tobacco: Not on file  . Alcohol Use: No    Review of Systems  Constitutional: Negative for fever and chills.  HENT: Positive for dental problem. Negative for trouble swallowing, neck pain and neck stiffness.   Eyes: Negative for visual disturbance.  Respiratory: Negative for chest tightness and shortness of breath.   Cardiovascular: Positive for chest pain.  Gastrointestinal: Negative for nausea, vomiting and abdominal pain.  Genitourinary: Negative for decreased urine volume and difficulty urinating.  Musculoskeletal: Positive for back pain. Negative for arthralgias.  Neurological: Negative for dizziness, weakness and headaches.  All other systems reviewed and are negative.    Allergies  Amoxicillin  Home Medications   Current Outpatient Rx  Name  Route  Sig  Dispense  Refill  . acetaminophen (TYLENOL) 500 MG tablet   Oral   Take 1,000 mg by mouth every 6 (six) hours as needed for pain.         [name] aspirin-acetaminophen-caffeine (EXCEDRIN MIGRAINE) 250-250-65 MG per tablet   Oral   Take 2 tablets by mouth every 6 (six) hours as needed for pain.         . Aspirin-Salicylamide-Caffeine (BC HEADACHE POWDER PO)   Oral   Take 2 packets by mouth every 6 (six) hours as needed (for pain).         [name] ibuprofen (ADVIL,MOTRIN) 200 MG tablet   Oral   Take 200-400 mg  by mouth every 6 (six) hours as needed for pain.          BP 113/73  Pulse 86  Temp(Src) 98.5 F (36.9 C) (Oral)  Resp 18  Ht 5' (1.524 m)  Wt 138 lb (62.596 kg)  BMI 26.95 kg/m2  SpO2 98% Physical Exam  Nursing note and vitals reviewed. Constitutional: He is oriented to person, place, and time. He appears well-developed and well-nourished. No distress.  Upon  walking into room, patient asleep - woke patient up with sternal rub, patient immediately woke up and came to attention Patient intoxicated, alcohol smelt on breath Patient slurring words secondary to alcohol  HENT:  Head: Normocephalic and atraumatic.  Negative facial swelling noted   Negative erythema, inflammation, exudate, petechiae noted to the posterior oropharynx. Negative sublingual lesions noted. Negative cyst and abscesses noted. No sign of damage noted to the second molar of the right mandible region. Discomfort upon palpation to the second right molar of mandible region.  Eyes: Conjunctivae and EOM are normal. Pupils are equal, round, and reactive to light. Right eye exhibits no discharge. Left eye exhibits no discharge.  Neck: Normal range of motion. Neck supple. No tracheal deviation present.  Negative cervical spine tenderness upon palpation   Cardiovascular: Normal rate, regular rhythm and normal heart sounds.  Exam reveals no friction rub.   No murmur heard. Pulmonary/Chest: Effort normal and breath sounds normal. No respiratory distress. He has no wheezes. He has no rales.  Musculoskeletal: Normal range of motion. He exhibits tenderness.       Back:  Neurological: He is alert and oriented to person, place, and time. He exhibits normal muscle tone. Coordination normal.  Cranial nerves III-XII grossly intact  Strength 5+/5+ with resistance to upper and lower extremities bilaterally  Patient currently intoxicated, but still able to follow commands thoroughly  Skin: Skin is warm and dry. No rash noted. He is not diaphoretic. No erythema.    ED Course   Procedures (including critical care time)  Upon initial interview with patient, patient continuously asking for pain medications due to his lower back pain. Discussed with patient that he cannot have pain medications due to alcohol intoxication - patient fell back asleep.    Date: 03/14/2013  Rate: 84  Rhythm: normal  sinus rhythm  QRS Axis: normal  Intervals: normal  ST/T Wave abnormalities: normal  Conduction Disutrbances:none  Narrative Interpretation:   Old EKG Reviewed: unchanged   Labs Reviewed  URINALYSIS, ROUTINE W REFLEX MICROSCOPIC  CBC WITH DIFFERENTIAL  COMPREHENSIVE METABOLIC PANEL  SALICYLATE LEVEL  URINE RAPID DRUG SCREEN (HOSP PERFORMED)  ETHANOL   No results found. No diagnosis found.  MDM  Patient presenting to the emergency department, intoxicated, complaining of dental pain and lower back pain. Smell of alcohol on patient's breath, slurred speech secondary to alcohol intoxication. Negative facial swelling noted. Negative pain upon palpation to the facial region. Discomfort noted upon palpation to the right second molar of the mandible region - negative signs of inflammation, erythema, abscess or cyst. Negative signs of Ludwig angina. Negative signs of peritonsillar abscess. Lungs clear to auscultation. Heart rate and rhythm normal. Full range of motion to upper and lower extremities bilaterally. Strength intact upper and lower extremities bilaterally. Discomfort upon palpation to the lower aspect of the back, lumbosacral region-paraspinal. EKG negative ischemic changes noted. CBC negative white blood cell elevation. CMP negative findings. Urinalysis negative for infection. Drug screen negative findings. Lumbar spine negative for acute abnormalities noted. Alcohol level 214.  Patient placed on IV fluids in order for patient to overcome intoxication. Doubt cardiac issue in nature. Definitive etiology of the second molar pain of the right mandible unknown. Definitive etiology of lumbar spine pain unknown. Discussed case with Jaynie Crumble, PA-C. Transfer of care to [facility], PA-C at change in shift.      Raymon Mutton, PA-C 03/14/13 0805  Raymon Mutton, PA-C 03/14/13 1413

## 2013-03-14 NOTE — ED Notes (Signed)
Patient ambulating in hall

## 2013-03-14 NOTE — Progress Notes (Signed)
[facility] provided patient with a list of primary care resources in [facility], dental resources, and information on a free dental clinic.

## 2013-03-14 NOTE — ED Notes (Signed)
Pt complains of tooth pain and lower back pain

## 2013-03-14 NOTE — ED Provider Notes (Signed)
8:49 AM Chest and out to me at shift change. Patient came in initially complaining of back pain, dental pain, chest pain. Patient was found to be heavily intoxicated. His blood work and lumbar spine films as well as urinalysis did not show any significant abnormality. Patient has been asleep since coming to emergency department. Plan is to monitor patient until he is awake and more sober to reassess him.  Just went and checked on patient. Patient's family is at bedside. Patient is still sleeping. Was able to briefly wake him up to ask, he was doing however patient just looked at me and said "fine" and closed his eyes back to sleep. At this time patient unable to indicate with me due to intoxication and will have to be monitored longer.  10:56 AM Patient is now awake, ambulated in the hallway, eating lunch. He is complaining of lower back pain. He denies any known injuries. He is labs, urine, x-rays are all unremarkable here. We'll start him on anti-inflammatories, followup with primary care doctor. Exam: Pt in NAD,  PERRLA, neck supple. Regular HR and rhythm, no murmur. Lungs clear to ausculation bilaterally. Cranial nerves intact. 5/5 and equal upper and lower extremities strength bilaterally. Gait normal.   Filed Vitals:   03/14/13 0343 03/14/13 0733 03/14/13 1012 03/14/13 1050  BP: 113/73 116/60 105/55 107/60  Pulse: 86 89 80 87  Temp: 98.5 F (36.9 C)   98.7 F (37.1 C)  TempSrc: Oral   Oral  Resp: 18 20 18 16   Height: 5' (1.524 m)     Weight: 138 lb (62.596 kg)     SpO2: 98% 98% 98% 99%     Chrys Landgrebe A Judit Awad, PA-C 03/14/13 1058

## 2013-03-15 NOTE — ED Provider Notes (Signed)
Medical screening examination/treatment/procedure(s) were performed by non-physician practitioner and as supervising physician I was immediately available for consultation/collaboration.  Denissa Cozart M Emmette Katt, MD 03/15/13 0801 

## 2013-03-17 NOTE — ED Provider Notes (Signed)
Medical screening examination/treatment/procedure(s) were performed by non-physician practitioner and as supervising physician I was immediately available for consultation/collaboration.   Laray Anger, DO 03/17/13 [phone]

## 2013-05-07 ENCOUNTER — Ambulatory Visit: Payer: Self-pay | Admitting: Internal Medicine

## 2013-06-17 ENCOUNTER — Encounter: Payer: Self-pay | Admitting: *Deleted

## 2013-06-24 ENCOUNTER — Ambulatory Visit: Payer: Self-pay | Admitting: Internal Medicine

## 2013-06-30 ENCOUNTER — Encounter: Payer: Self-pay | Admitting: Internal Medicine

## 2014-08-03 ENCOUNTER — Emergency Department (HOSPITAL_COMMUNITY)
Admission: EM | Admit: 2014-08-03 | Discharge: 2014-08-03 | Disposition: A | Discharge status: Home / Self Care | Payer: No Typology Code available for payment source | Attending: Emergency Medicine | Admitting: Emergency Medicine

## 2014-08-03 ENCOUNTER — Encounter (HOSPITAL_COMMUNITY): Payer: Self-pay | Admitting: Family Medicine

## 2014-08-03 DIAGNOSIS — Z87891 Personal history of nicotine dependence: Secondary | ICD-10-CM | POA: Insufficient documentation

## 2014-08-03 DIAGNOSIS — K089 Disorder of teeth and supporting structures, unspecified: Secondary | ICD-10-CM

## 2014-08-03 DIAGNOSIS — Z792 Long term (current) use of antibiotics: Secondary | ICD-10-CM | POA: Insufficient documentation

## 2014-08-03 DIAGNOSIS — G8929 Other chronic pain: Secondary | ICD-10-CM | POA: Insufficient documentation

## 2014-08-03 DIAGNOSIS — K088 Other specified disorders of teeth and supporting structures: Secondary | ICD-10-CM | POA: Insufficient documentation

## 2014-08-03 DIAGNOSIS — K029 Dental caries, unspecified: Secondary | ICD-10-CM

## 2014-08-03 DIAGNOSIS — Z880 Allergy status to penicillin: Secondary | ICD-10-CM | POA: Insufficient documentation

## 2014-08-03 DIAGNOSIS — Z791 Long term (current) use of non-steroidal anti-inflammatories (NSAID): Secondary | ICD-10-CM | POA: Insufficient documentation

## 2014-08-03 MED ORDER — DOXYCYCLINE HYCLATE 100 MG PO CAPS: 100.0000 mg | ORAL_CAPSULE | Freq: Two times a day (BID) | ORAL | Status: DC

## 2014-08-03 MED ORDER — HYDROCODONE-ACETAMINOPHEN 5-325 MG PO TABS
1.0000 | ORAL_TABLET | Freq: Once | ORAL | Status: AC
  Administered 2014-08-03: 1 via ORAL
  Filled 2014-08-03: qty 1

## 2014-08-03 MED ORDER — HYDROCODONE-ACETAMINOPHEN 5-325 MG PO TABS: 1.0000 | ORAL_TABLET | Freq: Four times a day (QID) | ORAL | Status: DC | PRN

## 2014-08-03 MED ORDER — NAPROXEN 500 MG PO TABS: 500.0000 mg | ORAL_TABLET | Freq: Two times a day (BID) | ORAL | Status: DC | PRN

## 2014-08-03 NOTE — ED Notes (Signed)
Pt here for dental pain and swelling. Sts whole in right lower tooth.

## 2014-08-03 NOTE — Discharge Instructions (Signed)
Apply warm compresses to jaw throughout the day. Take antibiotic until finished. Take naprosyn and norco as directed, as needed for pain but do not drive or operate machinery with pain medication use. Followup with a dentist is very important for ongoing evaluation and management of recurrent dental pain. Return to emergency department for emergent changing or worsening symptoms.   Emergency Department Resource Guide 1) Find a Doctor and Pay Out of Pocket Although you won't have to find out who is covered by your insurance plan, it is a good idea to ask around and get recommendations. You will then need to call the office and see if the doctor you have chosen will accept you as a new patient and what types of options they offer for patients who are self-pay. Some doctors offer discounts or will set up payment plans for their patients who do not have insurance, but you will need to ask so you aren't surprised when you get to your appointment.  2) Contact Your Local Health Department Not all health departments have doctors that can see patients for sick visits, but many do, so it is worth a call to see if yours does. If you don't know where your local health department is, you can check in your phone book. The CDC also has a tool to help you locate your state's health department, and many state websites also have listings of all of their local health departments.  3) Find a Walk-in Clinic If your illness is not likely to be very severe or complicated, you may want to try a walk in clinic. These are popping up all over the country in pharmacies, drugstores, and shopping centers. They're usually staffed by nurse practitioners or physician assistants that have been trained to treat common illnesses and complaints. They're usually fairly quick and inexpensive. However, if you have serious medical issues or chronic medical problems, these are probably not your best option.  No Primary Care Doctor: - Call  Health Connect at  [phone] - they can help you locate a primary care doctor that  accepts your insurance, provides certain services, etc. - Physician Referral Service- [phone]  Chronic Pain Problems: Organization         Address  Phone   Notes  [facility]  [phone] Patients need to be referred by their primary care doctor.   Medication Assistance: Organization         Address  Phone   Notes  [facility] Medication [facility] [address] [place], [place] [address] [phone] --Must be a resident of [facility] -- Must have NO insurance coverage whatsoever (no Medicaid/ Medicare, etc.) -- The pt. MUST have a primary care doctor that directs their care regularly and follows them in the community   [facility]  [phone]   [place]  [phone]     Dental Care: Organization         Address  Phone  Notes  [facility] Department of Main [facility] [address] [place], [place] [phone] Accepts children up to age 109 who are enrolled in [place] or [place] Health Choice; pregnant women with a Medicaid card; and children who have applied for Medicaid or [place] Health Choice, but were declined, whose parents can pay a reduced fee at time of service.  [facility] Department of [facility]  [address], [place] [phone] Accepts children up to age 69  who are enrolled in Medicaid or [place] Health Choice; pregnant women with a Medicaid card; and children who have applied for Medicaid or  Health Choice, but were declined, whose parents can pay a reduced fee at time of service.  [facility]  [address] [place], [place] [phone] Patients are seen by appointment only. Walk-ins are not accepted. [facility] will see patients 42 years of age and older. Monday - Tuesday (8am-5pm) Most Wednesdays (8:30-5pm) $30 per visit, cash only   [facility]  [address], [facility] [phone] Patients are seen by appointment only. Walk-ins are not accepted. [facility] will see patients 5 years of age and older. One Wednesday Evening (Monthly: Volunteer Based).  $30 per visit, cash only  [employer]  [phone] for adults; Children under age 40, call Graduate Pediatric Dentistry at [phone]. Children aged 66-14, please call [phone] to request a pediatric application.  Dental services are provided in all areas of dental care including fillings, crowns and bridges, complete and partial dentures, implants, gum treatment, root canals, and extractions. Preventive care is also provided. Treatment is provided to both adults and children. Patients are selected via a lottery and there is often a waiting list.   [facility] [address] [place]  [phone] [email or web address]   [facility] [address] [place], [place] [phone], Ext. [phone] Second and Fourth Thursday of each month, opens at 6:30 AM; Clinic ends at 9 AM.  Patients are seen on a first-come first-served basis, and a limited number are seen during each clinic.   [facility]  [address] [place], [place] [phone]   Eligibility Requirements You must have lived in [place], [place], or [place] counties for at least the last three months.   You cannot be eligible for state or federal sponsored [place], including [employer], [place], or [employer].   You generally cannot be eligible for healthcare insurance through your employer.    How to apply: Eligibility screenings are held every Tuesday and Wednesday afternoon from 1:00 pm until 4:00 pm. You do not need an appointment for the interview!  [facility] [address], [place], [place] [phone]   [facility] Department  [phone]    [facility]  [phone]   [facility]  [phone]       Dental Caries Dental caries (also called tooth decay) is the most common oral disease. It can occur at any age but is more common in children and young adults.  HOW DENTAL CARIES DEVELOPS  The process of decay begins when bacteria and foods (particularly sugars and starches) combine in your mouth to produce plaque. Plaque is a substance that sticks to the hard, outer surface of a tooth (enamel). The bacteria in plaque produce acids that attack enamel. These acids may also attack the root surface of a tooth (cementum) if it is exposed. Repeated attacks dissolve these surfaces and create holes in the tooth (cavities). If left untreated, the acids destroy the other layers of the tooth.  RISK FACTORS  Frequent sipping of sugary beverages.   Frequent snacking on sugary and starchy foods, especially those that easily get stuck in the teeth.   Poor oral hygiene.   Dry mouth.   Substance abuse such as methamphetamine abuse.   Broken or poor-fitting dental restorations.   Eating disorders.  Gastroesophageal reflux disease (GERD).   Certain radiation treatments to the head and neck. SYMPTOMS In the early stages of dental caries, symptoms are seldom present. Sometimes white, chalky areas may be seen on the enamel or other tooth layers. In later stages, symptoms may include:  Pits and holes on the enamel.  Toothache after sweet, hot, or cold foods or drinks are consumed.  Pain around the tooth.  Swelling around the tooth. DIAGNOSIS  Most of the time, dental caries is detected during a regular dental checkup. A diagnosis is made after a thorough medical and dental history is taken and the surfaces of your teeth are checked for signs of dental caries. Sometimes special instruments, such as lasers, are used to check for dental caries. Dental X-ray exams may be taken so that areas  not visible to the eye (such as between the contact areas of the teeth) can be checked for cavities.  TREATMENT  If dental caries is in its early stages, it may be reversed with a fluoride treatment or an application of a remineralizing agent at the dental office. Thorough brushing and flossing at home is needed to aid these treatments. If it is in its later stages, treatment depends on the location and extent of tooth destruction:   If a small area of the tooth has been destroyed, the destroyed area will be removed and cavities will be filled with a material such as gold, silver amalgam, or composite resin.   If a large area of the tooth has been destroyed, the destroyed area will be removed and a cap (crown) will be fitted over the remaining tooth structure.   If the center part of the tooth (pulp) is affected, a procedure called a root canal will be needed before a filling or crown can be placed.   If most of the tooth has been destroyed, the tooth may need to be pulled (extracted). HOME CARE INSTRUCTIONS You can prevent, stop, or reverse dental caries at home by practicing good oral hygiene. Good oral hygiene includes:  Thoroughly cleaning your teeth at least twice a day with a toothbrush and dental floss.   Using a fluoride toothpaste. A fluoride mouth rinse may also be used if recommended by your dentist or health care provider.   Restricting the amount of sugary and starchy foods and sugary liquids you consume.   Avoiding frequent snacking on these foods and sipping of these liquids.   Keeping regular visits with a dentist for checkups and cleanings. PREVENTION   Practice good oral hygiene.  Consider a dental sealant. A dental sealant is a coating material that is applied by your dentist to the pits and grooves of teeth. The sealant prevents food from being trapped in them. It may protect the teeth for several years.  Ask about fluoride supplements if you live in a  community without fluorinated water or with water that has a low fluoride content. Use fluoride supplements as directed by your dentist or health care provider.  Allow fluoride varnish applications to teeth if directed by your dentist or health care provider. Document Released: 04/01/2002 Document Revised: 11/24/2013 Document Reviewed: 07/12/2012 [facility] Patient Information 2015 [place], [place]. This information is not intended to replace advice given to you by your health care provider. Make sure you discuss any questions you have with your health care provider.  Dental Pain Toothache is pain in or around a tooth. It may get worse with chewing or with cold or heat.  HOME CARE  Your dentist may use a numbing medicine during treatment. If so, you may need to avoid eating until the medicine wears off. Ask your dentist about this.  Only take medicine as told by your dentist or doctor.  Avoid chewing food near the painful tooth until after all treatment is done. Ask your dentist about this. GET HELP RIGHT AWAY IF:   The problem gets worse or new problems appear.  You have a fever.  There is redness and puffiness (swelling) of the face, jaw, or neck.  You cannot open your mouth.  There is pain in the jaw.  There is very bad pain that is not helped by medicine. MAKE SURE YOU:   Understand these instructions.  Will watch your condition.  Will get help right away if you are not doing well or get worse. Document Released: 12/27/2007 Document Revised: 10/02/2011 Document Reviewed: 12/27/2007 [facility] Patient Information 2015 [place]. This information is not intended to replace advice given to you by your health care provider. Make sure you discuss any questions you have with your health care provider.

## 2014-08-03 NOTE — ED Provider Notes (Signed)
CSN: [id number]     Arrival date & time 08/03/14  1121 History  This chart was scribed for non-physician practitioner, Allen DerryMercedes Camprubi-Soms, PA-C working with Nelia Shiobert L Beaton, MD by Freida Busmaniana Omoyeni, ED Scribe. This patient was seen in room TR07C/TR07C and the patient's care was started at 11:51 AM.    Chief Complaint  Patient presents with  . Dental Pain     Patient is a 29 y.o. male presenting with tooth pain. The history is provided by the patient. No language interpreter was used.  Dental Pain Location:  Lower Lower teeth location:  18/LL 2nd molar and 29/RL 2nd bicuspid Quality:  Throbbing Severity:  Moderate Onset quality:  Gradual Duration:  2 weeks Timing:  Constant Progression:  Worsening Chronicity:  Recurrent Context: dental caries and dental fracture   Context: not abscess and not trauma   Relieved by:  Nothing Worsened by:  Touching, pressure and cold food/drink Ineffective treatments:  Acetaminophen and NSAIDs (BC powder and tylenol) Associated symptoms: gum swelling   Associated symptoms: no congestion, no difficulty swallowing, no drooling, no facial pain, no facial swelling, no fever, no headaches, no neck pain, no neck swelling, no oral bleeding, no oral lesions and no trismus   Risk factors: lack of dental care   Risk factors: no chewing tobacco use, no diabetes and no smoking      HPI Comments:  Robert Koch is a 29 y.o. healthy male who presents to the Emergency Department complaining of gradual onset 8/10 throbbing pain to his right lower and left lower teeth (#29 and 18) for~ 2 weeks. He has been taking BC pills, tylenol extra strength and tylenol PM without relief. Pain is exacerbated by chewing and cold air. Denies gingival swelling or drainge. He denies fever, chills, CP, SOB, congestion, dysphagia, neck swelling, trismus, drooling, abdominal pain, nausea, vomiting, and blurry vision or HA. Nonsmoker, no dentist care since 2009.     History reviewed.  No pertinent past medical history. Past Surgical History  Procedure Laterality Date  . 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 No pertinent family history. History  Substance Use Topics  . Smoking status: Former Smoker -- 0.10 packs/day for 1 years    Types: Cigarettes  . Smokeless tobacco: Not on file  . Alcohol Use: No    Review of Systems  Constitutional: Negative for fever and chills.  HENT: Positive for dental problem. Negative for congestion, drooling, ear pain, facial swelling, mouth sores, rhinorrhea, sore throat and trouble swallowing.   Eyes: Negative for visual disturbance.  Respiratory: Negative for shortness of breath.   Cardiovascular: Negative for chest pain.  Gastrointestinal: Negative for nausea, vomiting and abdominal pain.  Musculoskeletal: Negative for neck pain and neck stiffness.  Skin: Negative for rash.  Neurological: Negative for headaches.    10 Systems reviewed and all are negative for acute change except as noted in the HPI.    Allergies  Amoxicillin  Home Medications   Prior to Admission medications   Medication Sig Start Date End Date Taking? Authorizing Provider  acetaminophen (TYLENOL) 500 MG tablet Take 1,000 mg by mouth every 6 (six) hours as needed for pain.    Historical Provider, MD  aspirin-acetaminophen-caffeine (EXCEDRIN MIGRAINE) [phone] MG per tablet Take 2 tablets by mouth every 6 (six) hours as needed for pain.    Historical Provider, MD  Aspirin-Salicylamide-Caffeine (BC HEADACHE POWDER PO) Take 2 packets by mouth every 6 (six) hours as  needed (for pain).    Historical Provider, MD  ibuprofen (ADVIL,MOTRIN) 200 MG tablet Take 200-400 mg by mouth every 6 (six) hours as needed for pain.    Historical Provider, MD  naproxen (NAPROSYN) 500 MG tablet Take 1 tablet (500 mg total) by mouth 2 (two) times daily. 03/14/13   Tatyana A Kirichenko, PA-C   BP 113/69 mmHg  Pulse 90  Temp(Src) 98.6 F (37  C)  Resp 18  SpO2 96% Physical Exam  Constitutional: He is oriented to person, place, and time. Vital signs are normal. He appears well-developed and well-nourished.  Non-toxic appearance. No distress.  Afebrile, nontoxic, handling secretions  HENT:  Head: Normocephalic and atraumatic.  Nose: Nose normal.  Mouth/Throat: Uvula is midline, oropharynx is clear and moist and mucous membranes are normal. No oral lesions. No trismus in the jaw. Abnormal dentition. Dental caries present. No dental abscesses or uvula swelling.    R lower molar #29 and L lower molar #18 with TTP, tooth #17 with enamel defect/hole. Diffuse dental decay and caries. No obvious abscess. No gingival swelling or erythema, no lesions. Oropharynx clear and moist.   Eyes: Conjunctivae and EOM are normal. Right eye exhibits no discharge. Left eye exhibits no discharge.  Neck: Normal range of motion. Neck supple.  Cardiovascular: Normal rate.   Pulmonary/Chest: Effort normal. No respiratory distress.  Abdominal: Normal appearance. He exhibits no distension.  Musculoskeletal: Normal range of motion.  Lymphadenopathy:    He has no cervical adenopathy.  Neurological: He is alert and oriented to person, place, and time. He has normal strength. No sensory deficit.  Skin: Skin is warm, dry and intact. No rash noted.  Psychiatric: He has a normal mood and affect.  Nursing note and vitals reviewed.   ED Course  Procedures   DIAGNOSTIC STUDIES:  Oxygen Saturation is 96% on RA, normal by my interpretation.    COORDINATION OF CARE:  11:51 AM Discussed treatment plan with pt at bedside and pt agreed to plan.  Labs Review Labs Reviewed - No data to display  Imaging Review No results found.   EKG Interpretation None      MDM   Final diagnoses:  Chronic dental pain  Dental decay    29 y.o. male here with Dental pain associated with dental caries and possible dental abscess with patient afebrile, non toxic  appearing and swallowing secretions well. I gave patient referral to dentist and stressed the importance of dental follow up for ultimate management of dental pain.  I have also discussed reasons to return immediately to the ER.  Patient expresses understanding and agrees with plan.  I will also give doxycycline and pain control.    I personally performed the services described in this documentation, which was scribed in my presence. The recorded information has been reviewed and is accurate.   BP 113/69 mmHg  Pulse 90  Temp(Src) 98.6 F (37 C)  Resp 18  SpO2 96%  Meds ordered this encounter  Medications  . HYDROcodone-acetaminophen (NORCO/VICODIN) 5-325 MG per tablet 1 tablet    Sig:   . naproxen (NAPROSYN) 500 MG tablet    Sig: Take 1 tablet (500 mg total) by mouth 2 (two) times daily as needed for mild pain, moderate pain or headache (TAKE WITH MEALS.).    Dispense:  20 tablet    Refill:  0    Order Specific Question:  Supervising Provider    Answer:  Eber Hong D [[id number]]  . HYDROcodone-acetaminophen (NORCO) 5-325 MG  per tablet    Sig: Take 1 tablet by mouth every 6 (six) hours as needed for severe pain.    Dispense:  6 tablet    Refill:  0    Order Specific Question:  Supervising Provider    Answer:  Eber Hong D [[id number]]  . doxycycline (VIBRAMYCIN) 100 MG capsule    Sig: Take 1 capsule (100 mg total) by mouth 2 (two) times daily. One po bid x 7 days    Dispense:  14 capsule    Refill:  0    Order Specific Question:  Supervising Provider    Answer:  GABERIEL, YOUNGBLOOD [address] Camprubi-Soms, PA-C 08/03/14 1249  Nelia Shi, MD 08/04/14 [phone]

## 2015-02-09 ENCOUNTER — Encounter (HOSPITAL_COMMUNITY): Payer: Self-pay | Admitting: Emergency Medicine

## 2015-02-09 ENCOUNTER — Emergency Department (HOSPITAL_COMMUNITY)
Admission: EM | Admit: 2015-02-09 | Discharge: 2015-02-10 | Disposition: A | Discharge status: Home / Self Care | Payer: Self-pay | Attending: Emergency Medicine | Admitting: Emergency Medicine

## 2015-02-09 DIAGNOSIS — K029 Dental caries, unspecified: Secondary | ICD-10-CM | POA: Insufficient documentation

## 2015-02-09 DIAGNOSIS — Z792 Long term (current) use of antibiotics: Secondary | ICD-10-CM | POA: Insufficient documentation

## 2015-02-09 DIAGNOSIS — K002 Abnormalities of size and form of teeth: Secondary | ICD-10-CM | POA: Insufficient documentation

## 2015-02-09 DIAGNOSIS — K0889 Other specified disorders of teeth and supporting structures: Secondary | ICD-10-CM

## 2015-02-09 DIAGNOSIS — Z791 Long term (current) use of non-steroidal anti-inflammatories (NSAID): Secondary | ICD-10-CM | POA: Insufficient documentation

## 2015-02-09 DIAGNOSIS — Z9889 Other specified postprocedural states: Secondary | ICD-10-CM | POA: Insufficient documentation

## 2015-02-09 DIAGNOSIS — Z880 Allergy status to penicillin: Secondary | ICD-10-CM | POA: Insufficient documentation

## 2015-02-09 DIAGNOSIS — Z87891 Personal history of nicotine dependence: Secondary | ICD-10-CM | POA: Insufficient documentation

## 2015-02-09 DIAGNOSIS — K088 Other specified disorders of teeth and supporting structures: Secondary | ICD-10-CM | POA: Insufficient documentation

## 2015-02-09 NOTE — ED Notes (Signed)
Pt. reports right lower molar pain onset this week unrelieved by OTC Ibuprofen .

## 2015-02-09 NOTE — ED Provider Notes (Signed)
CSN: [id number]     Arrival date & time 02/09/15  2331 History  This chart was scribed for Robert Peek, PA-C, working with Marisa Severin, MD by Chestine Spore, ED Scribe. The patient was seen in room TR08C/TR08C at 11:53 PM.    Chief Complaint  Patient presents with  . Dental Pain      The history is provided by the patient. No language interpreter was used.    HPI Comments: Robert Koch is a 29 y.o. male who presents to the Emergency Department complaining of right lower dental pain onset 3 days. He notes that his dental pain is 8/10 at this time. He states that he has tried 800 mg ibuprofen with some relief for his symptoms, but has run out of this medication.[name] He denies drainage, SOB, trismus, fever, and any other symptoms. Pt denies having a dentist at this time.    History reviewed. No pertinent past medical history. Past Surgical History  Procedure Laterality Date  . 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 History  Substance Use Topics  . Smoking status: Former Smoker -- 0.10 packs/day for 1 years    Types: Cigarettes  . Smokeless tobacco: Not on file  . Alcohol Use: No    Review of Systems  Constitutional: Negative for fever.  HENT: Positive for dental problem.       Allergies  Amoxicillin  Home Medications   Prior to Admission medications   Medication Sig Start Date End Date Taking? Authorizing Provider  acetaminophen (TYLENOL) 500 MG tablet Take 1,000 mg by mouth every 6 (six) hours as needed for pain.    Historical Provider, MD  aspirin-acetaminophen-caffeine (EXCEDRIN MIGRAINE) [phone] MG per tablet Take 2 tablets by mouth every 6 (six) hours as needed for pain.    Historical Provider, MD  Aspirin-Salicylamide-Caffeine (BC HEADACHE POWDER PO) Take 2 packets by mouth every 6 (six) hours as needed (for pain).    Historical Provider, MD  doxycycline (VIBRAMYCIN) 100 MG capsule Take 1 capsule (100 mg total) by  mouth 2 (two) times daily. One po bid x 7 days 08/03/14   [name], PA-C  HYDROcodone-acetaminophen (NORCO) 5-325 MG per tablet Take 1 tablet by mouth every 6 (six) hours as needed for severe pain. 08/03/14   [name], PA-C  ibuprofen (ADVIL,MOTRIN) 800 MG tablet Take 1 tablet (800 mg total) by mouth 3 (three) times daily. 02/10/15   Robert Peek, PA-C  naproxen (NAPROSYN) 500 MG tablet Take 1 tablet (500 mg total) by mouth 2 (two) times daily. 03/14/13   [name], PA-C  naproxen (NAPROSYN) 500 MG tablet Take 1 tablet (500 mg total) by mouth 2 (two) times daily as needed for mild pain, moderate pain or headache (TAKE WITH MEALS.). 08/03/14   [name], PA-C   BP 125/89 mmHg  Pulse 74  Temp(Src) 98.4 F (36.9 C) (Oral)  Resp 22  Ht  (1.549 m)  Wt 145 lb (65.772 kg)  BMI 27.41 kg/m2  SpO2 100% Physical Exam  Constitutional: He is oriented to person, place, and time. He appears well-developed and well-nourished. No distress.  HENT:  Head: Normocephalic and atraumatic.  Mouth/Throat: Abnormal dentition.  Significant erosion and decay in the right mandibular central premolar and molars. Mucous membranes are moist. No unilateral tonsillar swelling, uvula midline, no glossal swelling or elevation. No trismus. No fluctuance or evidence of a drainable abscess. No other evidence of emergent infection,  Retropharyngeal or Peritonsillar abscess, Ludwig or Vincents angina. Tolerating secretions well. Patent airway   Eyes: EOM are normal.  Neck: Neck supple. No tracheal deviation present.  Cardiovascular: Normal rate.   Pulmonary/Chest: Effort normal. No respiratory distress.  Musculoskeletal: Normal range of motion.  Neurological: He is alert and oriented to person, place, and time.  Skin: Skin is warm and dry.  Psychiatric: He has a normal mood and affect. His behavior is normal.  Nursing note and vitals reviewed.   ED Course  Procedures  (including critical care time) NERVE BLOCK Performed by: Sharlene Mottsartner, Shakemia Madera W Consent: Verbal consent obtained. Required items: required blood products, implants, devices, and special equipment available Time out: Immediately prior to procedure a "time out" was called to verify the correct patient, procedure, equipment, support staff and site/side marked as required.  Indication: Dental pain  Nerve block body site: Right inferior alveolar   Preparation: Patient was prepped and draped in the usual sterile fashion. Needle gauge: 24 G Location technique: anatomical landmarks  Local anesthetic: Bupivacaine   Anesthetic total: 1.8 ml  Outcome: pain improved Patient tolerance: Patient tolerated the procedure well with no immediate complications.  COORDINATION OF CARE: 11:56 PM-Discussed treatment plan which includes dental block with pt at bedside and pt agreed to plan.   Labs Review Labs Reviewed - No data to display  Imaging Review No results found.   EKG Interpretation None     Meds given in ED:  Medications - No data to display  New Prescriptions   IBUPROFEN (ADVIL,MOTRIN) 800 MG TABLET    Take 1 tablet (800 mg total) by mouth 3 (three) times daily.   Filed Vitals:   02/09/15 2339 02/10/15 0027  BP: 126/78 125/89  Pulse: 86 74  Temp: 98.4 F (36.9 C)   TempSrc: Oral   Resp: 14 22  Height: 5\' 1"  (1.549 m)   Weight: 145 lb (65.772 kg)   SpO2:  100%    MDM  Vitals stable - WNL -afebrile Pt resting comfortably in ED. 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 No evidence of active infection. Will treat pain with anti-inflammatory's. Given referral to dentistry as well as outpatient follow-up resources. No evidence of other acute or emergent pathology at this time. I discussed all relevant lab findings and imaging results with pt and they verbalized understanding. Discussed f/u with  PCP within 48 hrs and return precautions, pt very amenable to plan.  Final diagnoses:  Pain, dental   I personally performed the services described in this documentation, which was scribed in my presence. The recorded information has been reviewed and is accurate.    Robert PeekBenjamin Datra Clary, PA-C 02/10/15 0031  Marisa Severinlga Otter, MD 02/10/15 [phone]

## 2015-02-10 MED ORDER — IBUPROFEN 800 MG PO TABS: 800.0000 mg | ORAL_TABLET | Freq: Three times a day (TID) | ORAL | Status: DC

## 2015-02-10 NOTE — Discharge Instructions (Signed)
Please take your medications as prescribed. Please follow-up with dentistry for further evaluation and management of your symptoms. Return to ED for new worsening symptoms.  Dental Pain A tooth ache may be caused by cavities (tooth decay). Cavities expose the nerve of the tooth to air and hot or cold temperatures. It may come from an infection or abscess (also called a boil or furuncle) around your tooth. It is also often caused by dental caries (tooth decay). This causes the pain you are having. DIAGNOSIS  Your caregiver can diagnose this problem by exam. TREATMENT   If caused by an infection, it may be treated with medications which kill germs (antibiotics) and pain medications as prescribed by your caregiver. Take medications as directed.  Only take over-the-counter or prescription medicines for pain, discomfort, or fever as directed by your caregiver.  Whether the tooth ache today is caused by infection or dental disease, you should see your dentist as soon as possible for further care. SEEK MEDICAL CARE IF: The exam and treatment you received today has been provided on an emergency basis only. This is not a substitute for complete medical or dental care. If your problem worsens or new problems (symptoms) appear, and you are unable to meet with your dentist, call or return to this location. SEEK IMMEDIATE MEDICAL CARE IF:   You have a fever.  You develop redness and swelling of your face, jaw, or neck.  You are unable to open your mouth.  You have severe pain uncontrolled by pain medicine. MAKE SURE YOU:   Understand these instructions.  Will watch your condition.  Will get help right away if you are not doing well or get worse. Document Released: 07/10/2005 Document Revised: 10/02/2011 Document Reviewed: 02/26/2008 [facility] Patient Information 2015 [place], [place]. This information is not intended to replace advice given to you by your health care provider. Make sure you discuss  any questions you have with your health care provider.  Dental Care and Dentist Visits Dental care supports good overall health. Regular dental visits can also help you avoid dental pain, bleeding, infection, and other more serious health problems in the future. It is important to keep the mouth healthy because diseases in the teeth, gums, and other oral tissues can spread to other areas of the body. Some problems, such as diabetes, heart disease, and pre-term labor have been associated with poor oral health.  See your dentist every 6 months. If you experience emergency problems such as a toothache or broken tooth, go to the dentist right away. If you see your dentist regularly, you may catch problems early. It is easier to be treated for problems in the early stages.  WHAT TO EXPECT AT A DENTIST VISIT  Your dentist will look for many common oral health problems and recommend proper treatment. At your regular dental visit, you can expect:  Gentle cleaning of the teeth and gums. This includes scraping and polishing. This helps to remove the sticky substance around the teeth and gums (plaque). Plaque forms in the mouth shortly after eating. Over time, plaque hardens on the teeth as tartar. If tartar is not removed regularly, it can cause problems. Cleaning also helps remove stains.  Periodic X-rays. These pictures of the teeth and supporting bone will help your dentist assess the health of your teeth.  Periodic fluoride treatments. Fluoride is a natural mineral shown to help strengthen teeth. Fluoride treatmentinvolves applying a fluoride gel or varnish to the teeth. It is most commonly done in  children.  Examination of the mouth, tongue, jaws, teeth, and gums to look for any oral health problems, such as:  Cavities (dental caries). This is decay on the tooth caused by plaque, sugar, and acid in the mouth. It is best to catch a cavity when it is small.  Inflammation of the gums caused by plaque  buildup (gingivitis).  Problems with the mouth or malformed or misaligned teeth.  Oral cancer or other diseases of the soft tissues or jaws. KEEP YOUR TEETH AND GUMS HEALTHY For healthy teeth and gums, follow these general guidelines as well as your dentist's specific advice:  Have your teeth professionally cleaned at the dentist every 6 months.  Brush twice daily with a fluoride toothpaste.  Floss your teeth daily.  Ask your dentist if you need fluoride supplements, treatments, or fluoride toothpaste.  Eat a healthy diet. Reduce foods and drinks with added sugar.  Avoid smoking. TREATMENT FOR ORAL HEALTH PROBLEMS If you have oral health problems, treatment varies depending on the conditions present in your teeth and gums.  Your caregiver will most likely recommend good oral hygiene at each visit.  For cavities, gingivitis, or other oral health disease, your caregiver will perform a procedure to treat the problem. This is typically done at a separate appointment. Sometimes your caregiver will refer you to another dental specialist for specific tooth problems or for surgery. SEEK IMMEDIATE DENTAL CARE IF:  You have pain, bleeding, or soreness in the gum, tooth, jaw, or mouth area.  A permanent tooth becomes loose or separated from the gum socket.  You experience a blow or injury to the mouth or jaw area. Document Released: 03/22/2011 Document Revised: 10/02/2011 Document Reviewed: 03/22/2011 [facility] Patient Information 2015 [place]. This information is not intended to replace advice given to you by your health care provider. Make sure you discuss any questions you have with your health care provider.

## 2015-02-10 NOTE — ED Notes (Signed)
Pt A&Ox4, ambulatory at d/c with steady gait, NAD 

## 2016-04-25 ENCOUNTER — Emergency Department (HOSPITAL_COMMUNITY)
Admission: EM | Admit: 2016-04-25 | Discharge: 2016-04-25 | Disposition: A | Discharge status: Home / Self Care | Payer: Self-pay | Attending: Emergency Medicine | Admitting: Emergency Medicine

## 2016-04-25 ENCOUNTER — Encounter (HOSPITAL_COMMUNITY): Payer: Self-pay | Admitting: Emergency Medicine

## 2016-04-25 DIAGNOSIS — Z7982 Long term (current) use of aspirin: Secondary | ICD-10-CM | POA: Insufficient documentation

## 2016-04-25 DIAGNOSIS — H109 Unspecified conjunctivitis: Secondary | ICD-10-CM | POA: Insufficient documentation

## 2016-04-25 DIAGNOSIS — Z87891 Personal history of nicotine dependence: Secondary | ICD-10-CM | POA: Insufficient documentation

## 2016-04-25 MED ORDER — TETRACAINE HCL 0.5 % OP SOLN
2.0000 [drp] | Freq: Once | OPHTHALMIC | Status: AC
  Administered 2016-04-25: 2 [drp] via OPHTHALMIC
  Filled 2016-04-25: qty 2

## 2016-04-25 MED ORDER — FLUORESCEIN SODIUM 1 MG OP STRP
2.0000 | ORAL_STRIP | Freq: Once | OPHTHALMIC | Status: AC
  Administered 2016-04-25: 2 via OPHTHALMIC
  Filled 2016-04-25: qty 2

## 2016-04-25 MED ORDER — POLYMYXIN B-TRIMETHOPRIM 10000-0.1 UNIT/ML-% OP SOLN: 1.0000 [drp] | OPHTHALMIC | 0 refills | Status: AC

## 2016-04-25 NOTE — Discharge Instructions (Signed)
Read the information below.  You can try taking zyrtec or allegra for relief of itching.  I have prescribed an antibiotic ointment to help prevent bacterial infection.  Practice good hand hygiene, avoid rubbing eyes, use a separate towel and pillow case.  If your symptoms persist follow up with the eye doctor, I have provided the contact information above.  Use the prescribed medication as directed.  Please discuss all new medications with your pharmacist.   You may return to the Emergency Department at any time for worsening condition or any new symptoms that concern you. Return to ED if you develop changes in vision, purulent discharge, or difficulty moving eye.

## 2016-04-25 NOTE — ED Triage Notes (Signed)
Pt states he started having redness in his left eye last night and itching/drainage. Denies purulent drainage. Pt states his little brother recently had pink eye and he was with him over the weekend.

## 2016-04-25 NOTE — ED Notes (Signed)
Bilateral eye redness, irritation and drainage. Onset this am.

## 2016-04-25 NOTE — ED Provider Notes (Signed)
[facility]-EMERGENCY DEPT Provider Note   CSN: [id number] Arrival date & time: 04/25/16  1245  By signing my name below, I, Robert Koch, attest that this documentation has been prepared under the direction and in the presence of  [name], PA-C. Electronically Signed: Christy Koch, ED Scribe. 04/25/16. [name]  History   Chief Complaint Chief Complaint  Patient presents with  . Eye Drainage   The history is provided by the patient and medical records. No language interpreter was used.    HPI Comments:  Robert Koch is a 30 y.o. male who presents to the Emergency Department complaining of gradually worsening pruritis, eyelid swelling and redness in his bilateral eyes, onset last night. He adds his eyes have been watery and that he woke up with light sensitivity which has now resolved.  He states that his brother was diagnosed with pink eye 5 days ago and the two spent the weekend toegether.  Pt reports PmHx of seasonal allergies, but asserts that he hasn't had an episode for a while.  He has tried nothing for his redness.  He doesn't wear contacts or glasses.  Pt denies drainage, eye pain, nasal congestion, visual disturbance, nausea, vomiting, neck pain, headache, and eye discharge as well as injury and contact with chemicals.    No abrasions   History reviewed. No pertinent past medical history.  Patient Active Problem List   Diagnosis Date Noted  . MITRAL VALVE PROLAPSE 09/07/2009  . ATRIAL FIBRILLATION 09/07/2009    Past Surgical History:  Procedure Laterality Date  . CARDIAC SURGERY     reports extra tissue around heart       Home Medications    Prior to Admission medications   Medication Sig Start Date End Date Taking? Authorizing Provider  acetaminophen (TYLENOL) 500 MG tablet Take 1,000 mg by mouth every 6 (six) hours as needed for pain.    Historical Provider, MD  aspirin-acetaminophen-caffeine (EXCEDRIN MIGRAINE) [phone] MG per tablet Take 2 tablets  by mouth every 6 (six) hours as needed for pain.    Historical Provider, MD  Aspirin-Salicylamide-Caffeine (BC HEADACHE POWDER PO) Take 2 packets by mouth every 6 (six) hours as needed (for pain).    Historical Provider, MD  doxycycline (VIBRAMYCIN) 100 MG capsule Take 1 capsule (100 mg total) by mouth 2 (two) times daily. One po bid x 7 days 08/03/14   [name], PA-C  HYDROcodone-acetaminophen (NORCO) 5-325 MG per tablet Take 1 tablet by mouth every 6 (six) hours as needed for severe pain. 08/03/14   [name], PA-C  ibuprofen (ADVIL,MOTRIN) 800 MG tablet Take 1 tablet (800 mg total) by mouth 3 (three) times daily. 02/10/15   [name], PA-C  naproxen (NAPROSYN) 500 MG tablet Take 1 tablet (500 mg total) by mouth 2 (two) times daily. 03/14/13   [name], PA-C  naproxen (NAPROSYN) 500 MG tablet Take 1 tablet (500 mg total) by mouth 2 (two) times daily as needed for mild pain, moderate pain or headache (TAKE WITH MEALS.). 08/03/14   [name], PA-C  trimethoprim-polymyxin b (POLYTRIM) ophthalmic solution Place 1 drop into both eyes every 4 (four) hours. 04/25/16 05/02/16  [name], PA-C    Family History History reviewed. No pertinent family history.  Social History Social History  Substance Use Topics  . Smoking status: Former Smoker    Packs/day: 0.10    Years: 1.00    Types: Cigarettes  . Smokeless tobacco: Never Used  . Alcohol use Yes  Comment: occassionally     Allergies   Amoxicillin   Review of Systems Review of Systems  Constitutional: Negative for fever.  HENT: Negative for congestion.   Eyes: Positive for photophobia ( resolved), redness and itching. Negative for pain, discharge and visual disturbance.  Gastrointestinal: Negative for nausea and vomiting.  Musculoskeletal: Negative for neck pain.  Neurological: Negative for headaches.     Physical Exam Updated Vital Signs BP 128/92 (BP Location: Left  Arm)   Pulse 82   Temp 98.3 F (36.8 C) (Oral)   Resp 16   Ht 5' (1.524 m)   Wt 148 lb (67.1 kg)   SpO2 100%   BMI 28.90 kg/m   Physical Exam  Constitutional: He is oriented to person, place, and time. He appears well-developed and well-nourished. No distress.  HENT:  Head: Normocephalic and atraumatic.  Eyes: Conjunctivae and EOM are normal. Pupils are equal, round, and reactive to light. Right eye exhibits no discharge. Left eye exhibits no discharge. No scleral icterus.  Mild eyelid swelling b/l. No proptosis. B/l conjunctival injection. No hemorrhage. PERRL. EOMs intact without pain. No abrasions, ulcerations or dendritic lesions on fluorescein. No seidel sign. No hypopyon or hyphema appreciated.  Neck: Normal range of motion.  Cardiovascular: Normal rate.   Pulmonary/Chest: Effort normal. No respiratory distress.  Abdominal: He exhibits no distension.  Musculoskeletal: Normal range of motion.  Neurological: He is alert and oriented to person, place, and time.  CN II-XII grossly intact  Skin: Skin is warm and dry. He is not diaphoretic.  Psychiatric: He has a normal mood and affect. His behavior is normal.  Nursing note and vitals reviewed.    ED Treatments / Results   DIAGNOSTIC STUDIES:  Oxygen Saturation is 100% on RA, NML by my interpretation.    COORDINATION OF CARE:  2:05 PM Will give antibiotic eye drops.  Pt instructed to return if he sees purulent drainage or begins experiencing visual disturbance.  Discussed treatment plan with pt at bedside and pt agreed to plan.  Labs (all labs ordered are listed, but only abnormal results are displayed) Labs Reviewed - No data to display  EKG  EKG Interpretation None       Radiology No results found.  Procedures Procedures (including critical care time)  Medications Ordered in ED Medications  fluorescein ophthalmic strip 2 strip (2 strips Both Eyes Given 04/25/16 1416)  tetracaine (PONTOCAINE) 0.5 %  ophthalmic solution 2 drop (2 drops Both Eyes Given by Other 04/25/16 1421)     Initial Impression / Assessment and Plan / ED Course  I have reviewed the triage vital signs and the nursing notes.  Pertinent labs & imaging results that were available during my care of the patient were reviewed by me and considered in my medical decision making (see chart for details).  Clinical Course    Patient presents to ED with b/l conjunctival injection. Patient is afebrile and non-toxic appearing in NAD. VSS. Presentation consistent with conjunctivitis. No purulent discharge, corneal abrasions, entrapment, consensual photophobia, or dendritic staining with fluorescein study. Presentation non-concerning for iritis, corneal abrasions, or HSV. Suspect ?viral vs. ?Allergic; however, given exposure to pink eye concern for bacterial . Will treat with ABX OP solution. Also discussed use of PO antihistamine for itch relief. Personal hygiene and frequent handwashing discussed.  Patient advised to followup with ophthalmologist if symptoms persist or worsen in any way including vision change or purulent discharge.  Return precautions given. Patient verbalizes understanding and is agreeable with  discharge.   Final Clinical Impressions(s) / ED Diagnoses   Final diagnoses:  Conjunctivitis of both eyes, unspecified conjunctivitis type    New Prescriptions Discharge Medication List as of 04/25/2016  2:13 PM    START taking these medications   Details  trimethoprim-polymyxin b (POLYTRIM) ophthalmic solution Place 1 drop into both eyes every 4 (four) hours., Starting Tue 04/25/2016, Until Tue 05/02/2016, Print       I personally performed the services described in this documentation, which was scribed in my presence. The recorded information has been reviewed and is accurate.     [name], PA-C 04/25/16 1427    Gwyneth Sprout, MD 04/25/16 2154

## 2019-05-06 ENCOUNTER — Other Ambulatory Visit: Payer: Self-pay

## 2019-05-06 ENCOUNTER — Emergency Department (HOSPITAL_COMMUNITY): Payer: Self-pay

## 2019-05-06 ENCOUNTER — Encounter (HOSPITAL_COMMUNITY): Payer: Self-pay | Admitting: Neurology

## 2019-05-06 ENCOUNTER — Emergency Department (HOSPITAL_COMMUNITY)
Admission: EM | Admit: 2019-05-06 | Discharge: 2019-05-06 | Disposition: A | Discharge status: Home / Self Care | Payer: Self-pay | Attending: Emergency Medicine | Admitting: Emergency Medicine

## 2019-05-06 DIAGNOSIS — R202 Paresthesia of skin: Secondary | ICD-10-CM | POA: Insufficient documentation

## 2019-05-06 DIAGNOSIS — R531 Weakness: Secondary | ICD-10-CM | POA: Insufficient documentation

## 2019-05-06 DIAGNOSIS — H538 Other visual disturbances: Secondary | ICD-10-CM | POA: Insufficient documentation

## 2019-05-06 DIAGNOSIS — R200 Anesthesia of skin: Secondary | ICD-10-CM | POA: Insufficient documentation

## 2019-05-06 DIAGNOSIS — R2990 Unspecified symptoms and signs involving the nervous system: Secondary | ICD-10-CM

## 2019-05-06 DIAGNOSIS — Z79899 Other long term (current) drug therapy: Secondary | ICD-10-CM | POA: Insufficient documentation

## 2019-05-06 DIAGNOSIS — R6813 Apparent life threatening event in infant (ALTE): Secondary | ICD-10-CM

## 2019-05-06 DIAGNOSIS — Z87891 Personal history of nicotine dependence: Secondary | ICD-10-CM | POA: Insufficient documentation

## 2019-05-06 LAB — CBC
HCT: 42.8 % (ref 39.0–52.0)
Hemoglobin: 13.7 g/dL (ref 13.0–17.0)
MCH: 28.2 pg (ref 26.0–34.0)
MCHC: 32 g/dL (ref 30.0–36.0)
MCV: 88.2 fL (ref 80.0–100.0)
Platelets: 141 10*3/uL — ABNORMAL LOW (ref 150–400)
RBC: 4.85 MIL/uL (ref 4.22–5.81)
RDW: 13.2 % (ref 11.5–15.5)
WBC: 4.2 10*3/uL (ref 4.0–10.5)
nRBC: 0 % (ref 0.0–0.2)

## 2019-05-06 LAB — CBG MONITORING, ED: Glucose-Capillary: 119 mg/dL — ABNORMAL HIGH (ref 70–99)

## 2019-05-06 LAB — I-STAT CHEM 8, ED
BUN: 16 mg/dL (ref 6–20)
Calcium, Ion: 1.2 mmol/L (ref 1.15–1.40)
Chloride: 100 mmol/L (ref 98–111)
Creatinine, Ser: 1 mg/dL (ref 0.61–1.24)
Glucose, Bld: 123 mg/dL — ABNORMAL HIGH (ref 70–99)
HCT: 43 % (ref 39.0–52.0)
Hemoglobin: 14.6 g/dL (ref 13.0–17.0)
Potassium: 3.9 mmol/L (ref 3.5–5.1)
Sodium: 137 mmol/L (ref 135–145)
TCO2: 29 mmol/L (ref 22–32)

## 2019-05-06 LAB — COMPREHENSIVE METABOLIC PANEL
ALT: 109 U/L — ABNORMAL HIGH (ref 0–44)
AST: 100 U/L — ABNORMAL HIGH (ref 15–41)
Albumin: 3.9 g/dL (ref 3.5–5.0)
Alkaline Phosphatase: 63 U/L (ref 38–126)
Anion gap: 11 (ref 5–15)
BUN: 13 mg/dL (ref 6–20)
CO2: 28 mmol/L (ref 22–32)
Calcium: 9.7 mg/dL (ref 8.9–10.3)
Chloride: 99 mmol/L (ref 98–111)
Creatinine, Ser: 1.11 mg/dL (ref 0.61–1.24)
GFR calc Af Amer: >60 mL/min (ref >60)
GFR calc non Af Amer: >60 mL/min (ref >60)
Glucose, Bld: 129 mg/dL — ABNORMAL HIGH (ref 70–99)
Potassium: 4 mmol/L (ref 3.5–5.1)
Sodium: 138 mmol/L (ref 135–145)
Total Bilirubin: 0.9 mg/dL (ref 0.3–1.2)
Total Protein: 7.4 g/dL (ref 6.5–8.1)

## 2019-05-06 LAB — PROTIME-INR
INR: 1 (ref 0.8–1.2)
Prothrombin Time: 12.9 seconds (ref 11.4–15.2)

## 2019-05-06 LAB — DIFFERENTIAL
Abs Immature Granulocytes: 0.02 10*3/uL (ref 0.00–0.07)
Basophils Absolute: 0 10*3/uL (ref 0.0–0.1)
Basophils Relative: 0 %
Eosinophils Absolute: 0 10*3/uL (ref 0.0–0.5)
Eosinophils Relative: 1 %
Immature Granulocytes: 1 %
Lymphocytes Relative: 17 %
Lymphs Abs: 0.7 10*3/uL (ref 0.7–4.0)
Monocytes Absolute: 0.4 10*3/uL (ref 0.1–1.0)
Monocytes Relative: 10 %
Neutro Abs: 3 10*3/uL (ref 1.7–7.7)
Neutrophils Relative %: 71 %

## 2019-05-06 LAB — ETHANOL: Alcohol, Ethyl (B): <10 mg/dL (ref <10)

## 2019-05-06 LAB — APTT: aPTT: 27 seconds (ref 24–36)

## 2019-05-06 MED ORDER — ASPIRIN 81 MG PO CHEW: 81.0000 mg | CHEWABLE_TABLET | Freq: Every day | ORAL | 0 refills | Status: AC

## 2019-05-06 MED ORDER — LORAZEPAM 2 MG/ML IJ SOLN
1.0000 mg | Freq: Once | INTRAMUSCULAR | Status: AC
  Administered 2019-05-06: 1 mg via INTRAVENOUS
  Filled 2019-05-06: qty 1

## 2019-05-06 MED ORDER — GADOBUTROL 1 MMOL/ML IV SOLN
6.0000 mL | Freq: Once | INTRAVENOUS | Status: AC | PRN
  Administered 2019-05-06: 6 mL via INTRAVENOUS

## 2019-05-06 NOTE — Code Documentation (Signed)
33yo male arriving to [facility] via [place] at 1330. Patient with sudden onset left arm numbness from shoulder down and left eye blurred vision at 1225. Patient also noted left arm weakness which resolved. Patient with h/o atrial fibrillation not on medications at this time. EMS called and activated a code stroke for left arm numbness and blurred vision. Stroke team at the bedside on patient arrival. Labs drawn and patient to CT with team. CT completed. NIHSS 1, see documentation for details and code stroke times. Patient with subjective left arm decreased sensation on exam. Dr. Leonie Man to the bedside. Code stroke canceled. Bedside handoff with ED RN Jerene Pitch.

## 2019-05-06 NOTE — ED Provider Notes (Signed)
[facility] EMERGENCY DEPARTMENT Provider Note   CSN: [id number] Arrival date & time: 05/06/19  1330  An emergency department physician performed an initial assessment on this suspected stroke patient at 1349.  History   Chief Complaint Chief Complaint  Patient presents with  . Code Stroke    HPI Robert Koch is a 33 y.o. male with history of A. fib and MVP otherwise healthy presents today via EMS for code stroke.  Patient seen and evaluated by neurology who advised patient no longer code stroke.  Patient arrives with sudden onset left arm numbness, weakness and blurred vision of the left eye at 12:25 PM this afternoon.  Symptoms have gradually improved.  CT head negative.  Neurology advises MRI brain with and without contrast, if negative discharge with cardiology follow-up regarding A. fib, advised patient needs to be on aspirin daily.  On my evaluation patient resting comfortably overall well-appearing and in no acute distress.  He reports he still has some mild paresthesias of the left upper extremity but no other symptoms to report at this time.  Reports that weakness and left eye blurred vision have completely resolved.  Denies fever/chills, headache, neck pain, chest pain, shortness of breath, abdominal pain, nausea/vomiting, diarrhea, dysuria/hematuria, fall/injury, change in balance, recent illness or any additional concerns.     HPI  No past medical history on file.  Patient Active Problem List   Diagnosis Date Noted  . MITRAL VALVE PROLAPSE 09/07/2009  . ATRIAL FIBRILLATION 09/07/2009    Past Surgical History:  Procedure Laterality Date  . CARDIAC SURGERY     reports extra tissue around heart        Home Medications    Prior to Admission medications   Medication Sig Start Date End Date Taking? Authorizing Provider  acetaminophen (TYLENOL) 500 MG tablet Take 1,000 mg by mouth every 6 (six) hours as needed for pain.    [provider]  aspirin-acetaminophen-caffeine (EXCEDRIN MIGRAINE) [phone] MG per tablet Take 2 tablets by mouth every 6 (six) hours as needed for pain.    [provider]  Aspirin-Salicylamide-Caffeine (BC HEADACHE POWDER PO) Take 2 packets by mouth every 6 (six) hours as needed (for pain).    [provider]  doxycycline (VIBRAMYCIN) 100 MG capsule Take 1 capsule (100 mg total) by mouth 2 (two) times daily. One po bid x 7 days 08/03/14   Street, Bradley, PA-C  HYDROcodone-acetaminophen (NORCO) 5-325 MG per tablet Take 1 tablet by mouth every 6 (six) hours as needed for severe pain. 08/03/14   Street, Mercedes, PA-C  ibuprofen (ADVIL,MOTRIN) 800 MG tablet Take 1 tablet (800 mg total) by mouth 3 (three) times daily. 02/10/15   Cartner, [name], PA-C  naproxen (NAPROSYN) 500 MG tablet Take 1 tablet (500 mg total) by mouth 2 (two) times daily. 03/14/13   Kirichenko, Tatyana, PA-C  naproxen (NAPROSYN) 500 MG tablet Take 1 tablet (500 mg total) by mouth 2 (two) times daily as needed for mild pain, moderate pain or headache (TAKE WITH MEALS.). 08/03/14   Street, [employer], PA-C    Family History No family history on file.  Social History Social History   Tobacco Use  . Smoking status: Former Smoker    Packs/day: 0.10    Years: 1.00    Pack years: 0.10    Types: Cigarettes  . Smokeless tobacco: Never Used  Substance Use Topics  . Alcohol use: Yes    Comment: occassionally  . Drug use: No  Allergies   Amoxicillin   Review of Systems Review of Systems Ten systems are reviewed and are negative for acute change except as noted in the HPI   Physical Exam Updated Vital Signs There were no vitals taken for this visit.  Physical Exam Constitutional:      General: He is not in acute distress.    Appearance: Normal appearance. He is well-developed. He is not ill-appearing or diaphoretic.  HENT:     Head: Normocephalic and atraumatic.     Right Ear: External ear  normal.     Left Ear: External ear normal.     Nose: Nose normal.  Eyes:     General: Vision grossly intact. Gaze aligned appropriately.     Pupils: Pupils are equal, round, and reactive to light.  Neck:     Musculoskeletal: Normal range of motion.     Trachea: Trachea and phonation normal. No tracheal deviation.  Pulmonary:     Effort: Pulmonary effort is normal. No respiratory distress.  Abdominal:     General: There is no distension.     Palpations: Abdomen is soft.     Tenderness: There is no abdominal tenderness. There is no guarding or rebound.  Musculoskeletal: Normal range of motion.  Skin:    General: Skin is warm and dry.  Neurological:     Mental Status: He is alert.     GCS: GCS eye subscore is 4. GCS verbal subscore is 5. GCS motor subscore is 6.     Comments: Mental Status: Alert, oriented, thought content appropriate, able to give a coherent history. Speech fluent without evidence of aphasia. Able to follow 2 step commands without difficulty. Cranial Nerves: II: Peripheral visual fields grossly normal, pupils equal, round, reactive to light III,IV, VI: ptosis not present, extra-ocular motions intact bilaterally V,VII: smile symmetric, eyebrows raise symmetric, facial light touch sensation equal VIII: hearing grossly normal to voice X: uvula elevates symmetrically XI: bilateral shoulder shrug symmetric and strong XII: midline tongue extension without fassiculations Motor: Normal tone. 5/5 strength in upper and lower extremities bilaterally including strong and equal grip strength and dorsiflexion/plantar flexion Sensory: Sensation intact to light touch in all extremities.Negative Romberg.  Cerebellar: normal finger-to-nose with bilateral upper extremities. Normal heel-to -shin balance bilaterally of the lower extremity. No pronator drift.  Gait: normal gait and balance CV: distal pulses palpable throughout  Psychiatric:        Behavior: Behavior normal.     ED Treatments / Results  Labs (all labs ordered are listed, but only abnormal results are displayed) Labs Reviewed  CBC - Abnormal; Notable for the following components:      Result Value   Platelets 141 (*)    All other components within normal limits  I-STAT CHEM 8, ED - Abnormal; Notable for the following components:   Glucose, Bld 123 (*)    All other components within normal limits  DIFFERENTIAL  ETHANOL  PROTIME-INR  APTT  COMPREHENSIVE METABOLIC PANEL  RAPID URINE DRUG SCREEN, HOSP PERFORMED  URINALYSIS, ROUTINE W REFLEX MICROSCOPIC    EKG None  Radiology Ct Head Code Stroke Wo Contrast  Result Date: 05/06/2019 CLINICAL DATA:  Code stroke. Left-sided numbness. Last seen normal 1225 hours EXAM: CT HEAD WITHOUT CONTRAST TECHNIQUE: Contiguous axial images were obtained from the base of the skull through the vertex without intravenous contrast. COMPARISON:  None. FINDINGS: Brain: Normal appearance without evidence of atrophy, old or acute infarction, mass lesion, hemorrhage, hydrocephalus or extra-axial collection. Vascular: No abnormal  vascular finding. Skull: Normal Sinuses/Orbits: Clear/normal Other: None ASPECTS (Alberta Stroke Program Early CT Score) - Ganglionic level infarction (caudate, lentiform nuclei, internal capsule, insula, M1-M3 cortex): 7 - Supraganglionic infarction (M4-M6 cortex): 3 Total score (0-10 with 10 being normal): 10 IMPRESSION: 1. Normal head CT 2. ASPECTS is 10. 3. These results were communicated to Dr. Pearlean BrownieSethi at 1:41 pmon 10/13/2020by text page via the [facility] messaging system. Electronically Signed   By: Paulina FusiMark  Shogry M.D.   On: 05/06/2019 13:44    Procedures Procedures (including critical care time)  Medications Ordered in ED Medications - No data to display   Initial Impression / Assessment and Plan / ED Course  I have reviewed the triage vital signs and the nursing notes.  Pertinent labs & imaging results that were available during my care  of the patient were reviewed by me and considered in my medical decision making (see chart for details).    CBG 119  i-STAT Chem-8 nonacute Ethanol negative aPTT within normal limit PT/INR within normal limits CMP nonacute CBC nonacute Urinalysis/UDS pending CT head:  IMPRESSION:  1. Normal head CT  2. ASPECTS is 10.  3. These results were communicated to Dr. Pearlean BrownieSethi at 1:41 pmon  10/13/2020by text page via the [facility] messaging system.  - Discussed case with neurologist Dr. Pearlean BrownieSethi who advises patient no longer code stroke.  Advises MRI brain with/without contrast, pending no acute findings discharge with cardiology follow-up on aspirin. - Care handoff given to Margaux Venter PA-C at shift change.  Plan of care at this time is follow-up on MRI brain and remaining labs, pending results follow neurology recommendations.   Note: Portions of this report may have been transcribed using voice recognition software. Every effort was made to ensure accuracy; however, inadvertent computerized transcription errors may still be present. Final Clinical Impressions(s) / ED Diagnoses   Final diagnoses:  None    ED Discharge Orders    None       Elizabeth PalauMorelli, Yerik Zeringue A, PA-C 05/06/19 1505    Terald Sleeperrifan, Matthew J, MD 05/06/19 2042

## 2019-05-06 NOTE — ED Provider Notes (Signed)
Care assumed from [employer], [place], at shift change, please see their notes for full documentation of patient's complaint/HPI. Briefly, pt here with sudden onset left arm numbness and weakness and left eye blurred vision this AM. Results so far show normal CT Head. Has already been evaluated by neurologist Dr. Leonie Man who does not believe pt's symptoms are stroke related. Awaiting MRI Brain. Plan is to discharge home if MRI negative. Dr. Leonie Man recommended cards follow up with Dr. Caryl Comes and to start patient on 81 mg ASA daily.   Physical Exam  BP 117/63   Pulse 92   Temp 99 F (37.2 C) (Oral)   Resp 20   Ht 5\' 1"  (1.549 m)   Wt 64.4 kg   SpO2 98%   BMI 26.83 kg/m   Physical Exam Vitals signs and nursing note reviewed.  Constitutional:      Appearance: He is not ill-appearing.  HENT:     Head: Normocephalic and atraumatic.  Eyes:     Conjunctiva/sclera: Conjunctivae normal.  Cardiovascular:     Rate and Rhythm: Normal rate and regular rhythm.  Pulmonary:     Effort: Pulmonary effort is normal.     Breath sounds: Normal breath sounds.  Skin:    General: Skin is warm and dry.     Coloration: Skin is not jaundiced.  Neurological:     Mental Status: He is alert.     Comments: CN 3-12 grossly intact A&O x4 GCS 15 Sensation and strength intact Coordination with finger-to-nose WNL Neg pronator drift     ED Course/Procedures     Procedures  MDM  MRI without any acute findings. Upon reevaluation pt's symptoms have fully resolved. He does report this has happened to him in the past but he was never evaluated for it. It appears that Dr. Leonie Man with neurology was considering a complex migraine as the cause of pt's symptoms which would seem likely if he has had episodes in the past with normal imaging today. Will discharge patient home at this time and have him follow up with Dr. Caryl Comes. I have stressed importance of following back up with him given hx of a fib and not currently on  any medications for it. Pt is in agreement with plan at this time. All questions have been answered. Stable for discharge home.   This note was prepared using Dragon voice recognition software and may include unintentional dictation errors due to the inherent limitations of voice recognition software.        Eustaquio Maize, PA-C 05/06/19 1719    Quintella Reichert, MD 05/07/19 1235

## 2019-05-06 NOTE — Discharge Instructions (Addendum)
Your CT and MRI of the brain were both normal today. It appears all of your symptoms have resolved while you were in the ED today.  Please follow up with Dr. Caryl Comes with cardiology  Take aspirin daily as prescribed

## 2019-05-06 NOTE — Consult Note (Addendum)
Neurology Consultation  Reason for Consult: Code stroke Referring Physician: Trifan  History is obtained from: Patient and EMS  HPI: JUANYA VILLAVICENCIO is a 33 y.o. male with no significant past medical history however he does state back in 2011 he did have a heart cath.  He is not quite sure what this was done for.  Looking in the chart I could not find anything in care everywhere.  There is an echocardiogram that shows EF of 50 to 55%.   wall motion within normal limits.  Patient states that he was in the car, in the passenger seat when he suddenly noted at approximately 1230 that he had blurred vision in the left eye-left eye only-and weakness in his left arm along with paresthesias.  EMS was called and at the time of arrival to the hospital patient's left arm weakness had resolved and he felt as though his blurred vision in his left eye was resolving.  He still has a decrease sensation in the left arm.  Patient states that he does drink approximately 6 beers a night but not every night and he did drink last night.  He denies any illicit drugs.  He does not take any medications.  He is not complaining of any further neurological symptoms.   Work up that has been done: CT head   LKW: 1230 tpa given?: no, minimal symptoms Premorbid modified Rankin scale (mRS): 0 NIH stroke scale 1  ROS: A 14 point ROS was performed and is negative except as noted in the HPI.  No past medical history on file. 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  Seen by Dr. Graciela Husbands in [place] and at [facility] and [facility] considered for elective ablation but not done.  Patient was meant to be on flecainide and aspirin but discontinued medications  Family History  Problem Relation Age of Onset  . Hypertension Mother   . Hypertension Father    Social History:   reports that he has quit smoking. His smoking use included cigarettes. He has a 0.10 pack-year smoking history. He has never used  smokeless tobacco. He reports current alcohol use. He reports that he does not use drugs.  Medications No current facility-administered medications for this encounter.   Current Outpatient Medications:  .  acetaminophen (TYLENOL) 500 MG tablet, Take 1,000 mg by mouth every 6 (six) hours as needed for pain., Disp: , Rfl:  .  aspirin-acetaminophen-caffeine (EXCEDRIN MIGRAINE) 250-250-65 MG per tablet, Take 2 tablets by mouth every 6 (six) hours as needed for pain., Disp: , Rfl:  .  Aspirin-Salicylamide-Caffeine (BC HEADACHE POWDER PO), Take 2 packets by mouth every 6 (six) hours as needed (for pain)., Disp: , Rfl:  .  doxycycline (VIBRAMYCIN) 100 MG capsule, Take 1 capsule (100 mg total) by mouth 2 (two) times daily. One po bid x 7 days, Disp: 14 capsule, Rfl: 0 .  HYDROcodone-acetaminophen (NORCO) 5-325 MG per tablet, Take 1 tablet by mouth every 6 (six) hours as needed for severe pain., Disp: 6 tablet, Rfl: 0 .  ibuprofen (ADVIL,MOTRIN) 800 MG tablet, Take 1 tablet (800 mg total) by mouth 3 (three) times daily., Disp: 21 tablet, Rfl: 0 .  naproxen (NAPROSYN) 500 MG tablet, Take 1 tablet (500 mg total) by mouth 2 (two) times daily., Disp: 30 tablet, Rfl: 0 .  naproxen (NAPROSYN) 500 MG tablet, Take 1 tablet (500 mg total) by mouth 2 (two) times daily as needed for mild pain, moderate pain or  headache (TAKE WITH MEALS.)., Disp: 20 tablet, Rfl: 0   Exam: Current vital signs: BP 134/83 (BP Location: Right Arm)   Pulse 92   Temp 99 F (37.2 C) (Oral)   Resp 15   Ht 5\' 1"  (1.549 m)   Wt 64.4 kg   SpO2 99%   BMI 26.83 kg/m  Vital signs in last 24 hours: Temp:  [99 F (37.2 C)] 99 F (37.2 C) (10/13 1340) Pulse Rate:  [92] 92 (10/13 1340) Resp:  [15] 15 (10/13 1340) BP: (134)/(83) 134/83 (10/13 1340) SpO2:  [99 %-100 %] 99 % (10/13 1340) Weight:  [64.4 kg] 64.4 kg (10/13 1355)  Physical Exam  Constitutional: Appears well-developed and well-nourished young African-American male not in  distress.  Psych: Affect appropriate to situation Eyes: No scleral injection HENT: No OP obstrucion Head: Normocephalic.  Cardiovascular: Normal rate and regular rhythm.  Respiratory: Effort normal, non-labored breathing GI: Soft.  No distension. There is no tenderness.  Skin: WDI  Neuro: Mental Status: Patient is awake, alert, oriented to person, place, month, year, and situation. Patient is able to give a clear and coherent history. No signs of aphasia or neglect Cranial Nerves: II: Visual Fields are full.  States that he has blurred vision in the left eye only III,IV, VI: EOMI without ptosis or diploplia. Pupils equal, round and reactive to light V: Facial sensation is symmetric to temperature VII: Facial movement is symmetric.  VIII: hearing is intact to voice X: Palat elevates symmetrically XI: Shoulder shrug is symmetric. XII: tongue is midline without atrophy or fasciculations.  Motor: Tone is normal. Bulk is normal. 5/5 strength was present in all four extremities.  Sensory: Sensation is symmetric to light touch and temperature and right arm and bilateral legs however decreased on the left arm and face with splitting of the midline.  He also splits vibration sensation on his forehead. Deep Tendon Reflexes: 2+ and symmetric in the biceps and patellae.  Plantars: Toes are downgoing bilaterally.  Cerebellar: FNF and HKS are intact bilaterally  NIHSS 0  Labs I have reviewed labs in epic and the results pertinent to this consultation are:   CBC    Component Value Date/Time   WBC 4.2 05/06/2019 1331   RBC 4.85 05/06/2019 1331   HGB 14.6 05/06/2019 1338   HCT 43.0 05/06/2019 1338   PLT 141 (L) 05/06/2019 1331   MCV 88.2 05/06/2019 1331   MCH 28.2 05/06/2019 1331   MCHC 32.0 05/06/2019 1331   RDW 13.2 05/06/2019 1331   LYMPHSABS 0.7 05/06/2019 1331   MONOABS 0.4 05/06/2019 1331   EOSABS 0.0 05/06/2019 1331   BASOSABS 0.0 05/06/2019 1331    CMP      Component Value Date/Time   NA 137 05/06/2019 1338   K 3.9 05/06/2019 1338   CL 100 05/06/2019 1338   CO2 21 03/14/2013 0500   GLUCOSE 123 (H) 05/06/2019 1338   BUN 16 05/06/2019 1338   CREATININE 1.00 05/06/2019 1338   CALCIUM 8.8 03/14/2013 0500   PROT 7.3 03/14/2013 0500   ALBUMIN 3.6 03/14/2013 0500   AST 21 03/14/2013 0500   ALT 13 03/14/2013 0500   ALKPHOS 69 03/14/2013 0500   BILITOT 0.1 (L) 03/14/2013 0500   GFRNONAA >90 03/14/2013 0500   GFRAA >90 03/14/2013 0500    Lipid Panel  No results found for: CHOL, TRIG, HDL, CHOLHDL, VLDL, LDLCALC, LDLDIRECT   Imaging I have reviewed the images obtained:  CT-scan of the brain-normal appearance without evidence  of atrophy, old or acute infarct, mass lesion.  MRI examination of the brain-pending  Etta Quill PA-C [facility] [phone]  M-F  (9:00 am- 5:00 PM)  05/06/2019, 2:00 PM     Assessment:  33 year old male presenting to the hospital with sudden onset of blurred vision in left eye along with decreased sensation weakness in left arm.  Patient continues to have blurred vision left eye and paresthesia of left arm but strength is returned.  Given blurred vision in left eye along with sensory subjective symptoms on the same side with nonorganic pattern of sensory loss less likely stroke other possibilities in a young individual include multiple sclerosis or or atypical migraine or vasculitis.  Past medical history of atrial arrhythmias is interesting.  To evaluate for both will obtain MRI of brain with and without contrast.  TPA is not indicated at this time as patient has symptoms that are too minimal to treat.   Impression: -Strokelike episode -Possible multiple sclerosis versus atypical migraine  Recommendations: -MRI brain with and without contrast which has been ordered Patient may be discharged home if MRI is unyielding. Recommend start aspirin daily and follow-up with electrophysiologist Dr.  Caryl Comes for his cardiac arrhythmias Discussed with Dr. Langston Masker I have personally obtained history,examined this patient, reviewed notes, independently viewed imaging studies, participated in medical decision making and plan of care.ROS completed by me personally and pertinent positives fully documented  I have made any additions or clarifications directly to the above note. Agree with note above.  Greater than 50% time during this 80-minute consultation visit was spent on counseling and coordination of care about his strokelike episode and discussion about differential diagnosis and evaluation and treatment plan and answering questions  Antony Contras, MD Medical Director [place] Pager: [phone] 05/06/2019 3:21 PM

## 2019-05-06 NOTE — ED Notes (Signed)
Patient transported to MRI 

## 2019-05-06 NOTE — ED Triage Notes (Signed)
Patient arrives via [facility] as a Code Stroke after sudden onset of L arm numbness/tingling and weakness and L eye blurred vision at 1225 - was passenger in car with family at the time. Patient reports he also had sweaty palms and felt like he was going to pass out and thought he was dehydrated, however symptoms did not improve after drinking water and he called EMS. He states L arm numbness and tingling have gradually improved since onset, but are still there. Per EMS, slight L arm drift noted on scene, but had resolved upon getting him into ambulance. He endorses drinking alcohol last night, "I don't drink every night but when I do, I have about 6 beers a night." EMS VS: 133/78, HR 93 NSR, RR 16, 100% RA, CBG 141. 18g. PIV LAC. Patient is A&O x 4, no extremity drift, speech changes, headache, visual deficits (besides blurred vision of L eye). Also endorses history of heart attack when he was 33 years old.

## 2019-05-08 ENCOUNTER — Ambulatory Visit (INDEPENDENT_AMBULATORY_CARE_PROVIDER_SITE_OTHER): Payer: Self-pay | Admitting: Cardiology

## 2019-05-08 ENCOUNTER — Encounter: Payer: Self-pay | Admitting: *Deleted

## 2019-05-08 ENCOUNTER — Encounter: Payer: Self-pay | Admitting: Cardiology

## 2019-05-08 ENCOUNTER — Ambulatory Visit (INDEPENDENT_AMBULATORY_CARE_PROVIDER_SITE_OTHER): Payer: Self-pay

## 2019-05-08 ENCOUNTER — Other Ambulatory Visit: Payer: Self-pay

## 2019-05-08 VITALS — BP 114/76 | HR 74 | Temp 97.9°F | Ht 61.0 in | Wt 144.0 lb

## 2019-05-08 DIAGNOSIS — I480 Paroxysmal atrial fibrillation: Secondary | ICD-10-CM

## 2019-05-08 DIAGNOSIS — G459 Transient cerebral ischemic attack, unspecified: Secondary | ICD-10-CM

## 2019-05-08 DIAGNOSIS — I456 Pre-excitation syndrome: Secondary | ICD-10-CM

## 2019-05-08 NOTE — Progress Notes (Signed)
Cardiology Office Note:    Date:  05/08/2019   ID:  Robert Koch, DOB July 21, 1986, MRN [id number]  PCP:  Patient, No Pcp Per  Cardiologist:  No primary care provider on file.  Electrophysiologist:  None   Referring MD: No ref. provider found   Chief Complaint  Patient presents with  . Follow-up    [place]   History of Present Illness:    Robert Koch is a 33 y.o. 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    Per the records the patient  Underwent electrophysiologic testing given his young age. 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 Flecanide therapy was recommended.  While on flecainide 50 mg twice daily he did have he has had at least one breakthrough atrial fibrillation but then he stopped taking it becasue he thought medication was not working.  He was then referred to [facility] for further cryoablation versus radiofrequency ablation however the patient never followed up.    The patient had not had any cardiac care since 2011.  Is also noted that the patient talk with Jolene Provost and his mother talked to the program at [facility]. Apparently neither hospital was willing to see him for his ablation because of lack of insurance.  He was referred after he presented to the ED with symptoms of left upper and lower extremity numbness.  He underwent MRI which reported no infarction and with arms to see cardiology given history.   Today he denies any chest pain, shortness of breath, nausea, vomiting.  States that his previous symptoms have resolved.   Past Medical History:  Diagnosis Date  . Concealed accessory pathway   . PAF (paroxysmal atrial fibrillation) (HCC)   . TIA (transient ischemic attack)     Past Surgical History:  Procedure Laterality Date  . CARDIAC SURGERY  2010   reports extra tissue around heart/Catherization    Current Medications: Current Meds   Medication Sig  . acetaminophen (TYLENOL) 500 MG tablet Take 1,000 mg by mouth every 6 (six) hours as needed for pain.  . Aspirin-Salicylamide-Caffeine (BC HEADACHE POWDER PO) Take 2 packets by mouth every 6 (six) hours as needed (for pain).     Allergies:   Amoxicillin   Social History   Socioeconomic History  . Marital status: Single    Spouse name: Not on file  . Number of children: Not on file  . Years of education: Not on file  . Highest education level: Not on file  Occupational History  . Not on file  Social Needs  . Financial resource strain: Not on file  . Food insecurity    Worry: Not on file    Inability: Not on file  . Transportation needs    Medical: Not on file    Non-medical: Not on file  Tobacco Use  . Smoking status: Former Smoker    Packs/day: 0.10    Years: 1.00    Pack years: 0.10    Types: Cigarettes  . Smokeless tobacco: Never Used  Substance and Sexual Activity  . Alcohol use: Yes    Comment: occassionally  . Drug use: No  . Sexual activity: Not on file  Lifestyle  . Physical activity    Days per week: Not on file    Minutes per session: Not on file  . Stress: Not on file  Relationships  . Social  connections    Talks on phone: Not on file    Gets together: Not on file    Attends religious service: Not on file    Active member of club or organization: Not on file    Attends meetings of clubs or organizations: Not on file    Relationship status: Not on file  Other Topics Concern  . Not on file  Social History Narrative  . Not on file     Family History: The patient's family history includes Atrial fibrillation in his mother; COPD in his maternal grandmother; Diabetes in his father; Heart disease in his maternal grandmother; Hypertension in his father, maternal grandmother, and mother; Sickle cell anemia in his maternal grandmother.  ROS:   Review of Systems  Constitution: Negative for decreased appetite, fever and weight gain.  HENT:  Negative for congestion, ear discharge, hoarse voice and sore throat.   Eyes: Negative for discharge, redness, vision loss in right eye and visual halos.  Cardiovascular: Negative for chest pain, dyspnea on exertion, leg swelling, orthopnea and palpitations.  Respiratory: Negative for cough, hemoptysis, shortness of breath and snoring.   Endocrine: Negative for heat intolerance and polyphagia.  Hematologic/Lymphatic: Negative for bleeding problem. Does not bruise/bleed easily.  Skin: Negative for flushing, nail changes, rash and suspicious lesions.  Musculoskeletal: Negative for arthritis, joint pain, muscle cramps, myalgias, neck pain and stiffness.  Gastrointestinal: Negative for abdominal pain, bowel incontinence, diarrhea and excessive appetite.  Genitourinary: Negative for decreased libido, genital sores and incomplete emptying.  Neurological: Negative for brief paralysis, focal weakness, headaches and loss of balance.  Psychiatric/Behavioral: Negative for altered mental status, depression and suicidal ideas.  Allergic/Immunologic: Negative for HIV exposure and persistent infections.    EKGs/Labs/Other Studies Reviewed:    The following studies were reviewed today:   EKG:  The ekg ordered today demonstrates sinus rhythm, heart rate 74 bpm, compared to EKG performed on May 06, 2019 she was also in sinus rhythm at that time with a heart rate 90 bpm.  His EKG back in 2011 show evidence of atrial fibrillation rapid ventricular rate.  TTE dated 08/18/2009 Study Conclusions  - Left ventricle: The cavity size was normal. Wall thickness was   normal. Systolic function was normal. The estimated ejection   fraction was in the range of 50% to 55%. Wall motion was normal;   there were no regional wall motion abnormalities. There was a   reduced contribution of atrial contraction to ventricular filling,   due to increased ventricular diastolic pressure or atrial   contractile  dysfunction.  - Mitral valve: Mildly myxomatous anterior mitral valve leaflet.   Systolic bowing without prolapse.  - Atrial septum: No defect or patent foramen ovale was identified.  - Pulmonic valve: Mild regurgitation.  Transthoracic echocardiography. M-mode, complete 2D, spectral  Doppler, and color Doppler.  MRI Brain IMPRESSION: 05/06/2019 Normal for age MRI appearance of the brain. No evidence of acute intracranial abnormality.  CT head 05/06/2019. IMPRESSION: 1. Normal head CT 2. ASPECTS is 10. 3. These results were communicated to Dr. Pearlean Brownie at 1:41 pmon  Recent Labs: 05/06/2019: ALT 109; BUN 16; Creatinine, Ser 1.00; Hemoglobin 14.6; Platelets 141; Potassium 3.9; Sodium 137  Recent Lipid Panel No results found for: CHOL, TRIG, HDL, CHOLHDL, VLDL, LDLCALC, LDLDIRECT  Physical Exam:    VS:  BP 114/76 (BP Location: Right Arm, Patient Position: Sitting, Cuff Size: Normal)   Pulse 74   Temp 97.9 F (36.6 C)   Ht 5\' 1"  (1.549  m)   Wt 144 lb (65.3 kg)   SpO2 97%   BMI 27.21 kg/m     Wt Readings from Last 3 Encounters:  05/08/19 144 lb (65.3 kg)  05/06/19 141 lb 15.6 oz (64.4 kg)  04/25/16 148 lb (67.1 kg)     GEN: Well nourished, well developed in no acute distress HEENT: Normal NECK: No JVD; No carotid bruits LYMPHATICS: No lymphadenopathy CARDIAC: S1S2 noted,RRR, no murmurs, rubs, gallops RESPIRATORY:  Clear to auscultation without rales, wheezing or rhonchi  ABDOMEN: Soft, non-tender, non-distended, +bowel sounds, no guarding. EXTREMITIES: No edema, No cyanosis, no clubbing MUSCULOSKELETAL:  No edema; No deformity  SKIN: Warm and dry NEUROLOGIC:  Alert and oriented x 3, non-focal PSYCHIATRIC:  Normal affect, good insight  ASSESSMENT:    1. Paroxysmal atrial fibrillation (HCC)   2. TIA (transient ischemic attack)   3. Concealed accessory pathway    PLAN:     He is in sinus rhythm today.  Given his recent occurrence with his neurologic symptoms.   I am going to place a monitor on the patient for 7 days.  In addition a transthoracic echocardiogram with be ordered LV/RV function and any structural abnormalities  I did stress heavily with patient and his mother who was on the phone that he needs to follow-up with EP.  Because he needs to discuss with further management of his concealed accessory pathway.  The patient and his mom both are requesting to be placed back on flecainide which he was on in the past.  However at this time I have educated him that we do need to get his echocardiogram to see there are any structural abnormalities.    Refer patient to our EP colleagues they prefer to see Dr. Graciela HusbandsKlein, because he saw him 2011.  I have asked patient to continue with his aspirin 81 milligrams as prescribed for his TIA.  The patient is in agreement with the above plan. The patient left the office in stable condition.  The patient will follow up in 3 months.   Medication Adjustments/Labs and Tests Ordered: Current medicines are reviewed at length with the patient today.  Concerns regarding medicines are outlined above.  Orders Placed This Encounter  Procedures  . Ambulatory referral to Cardiac Electrophysiology  . LONG TERM MONITOR (3-14 DAYS)  . EKG 12-Lead  . ECHOCARDIOGRAM COMPLETE   No orders of the defined types were placed in this encounter.   Patient Instructions  Medication Instructions:  Your physician recommends that you continue on your current medications as directed. Please refer to the Current Medication list given to you today.  *If you need a refill on your cardiac medications before your next appointment, please call your pharmacy*  Lab Work: None  If you have labs (blood work) drawn today and your tests are completely normal, you will receive your results only by: [name]. MyChart Message (if you have MyChart) OR . A paper copy in the mail If you have any lab test that is abnormal or we need to change your treatment,  we will call you to review the results.  Testing/Procedures: You had an EKG today.   Your physician has requested that you have an echocardiogram. Echocardiography is a painless test that uses sound waves to create images of your heart. It provides your doctor with information about the size and shape of your heart and how well your heart's chambers and valves are working. This procedure takes approximately one hour. There are no restrictions for  this procedure.  Your physician has recommended that you wear a ZIO monitor. ZIO monitors are medical devices that record the heart's electrical activity. Doctors most often use these monitors to diagnose arrhythmias. Arrhythmias are problems with the speed or rhythm of the heartbeat. The monitor is a small, portable device. You can wear one while you do your normal daily activities. This is usually used to diagnose what is causing palpitations/syncope (passing out). Wear for 3 days.   You have been referred to see an electrophysiologist, Dr. Curt Bears, due to atrial fibrillation. You will be contacted to schedule this appointment.   Follow-Up: At [facility], you and your health needs are our priority.  As part of our continuing mission to provide you with exceptional heart care, we have created designated Provider Care Teams.  These Care Teams include your primary Cardiologist (physician) and Advanced Practice Providers (APPs -  Physician Assistants and Nurse Practitioners) who all work together to provide you with the care you need, when you need it.  Your next appointment:   3 months  The format for your next appointment:   In Person  Provider:   Berniece Salines, DO    Echocardiogram An echocardiogram is a procedure that uses painless sound waves (ultrasound) to produce an image of the heart. Images from an echocardiogram can provide important information about:  Signs of coronary artery disease (CAD).  Aneurysm detection. An aneurysm is a  weak or damaged part of an artery wall that bulges out from the normal force of blood pumping through the body.  Heart size and shape. Changes in the size or shape of the heart can be associated with certain conditions, including heart failure, aneurysm, and CAD.  Heart muscle function.  Heart valve function.  Signs of a past heart attack.  Fluid buildup around the heart.  Thickening of the heart muscle.  A tumor or infectious growth around the heart valves. Tell a health care provider about:  Any allergies you have.  All medicines you are taking, including vitamins, herbs, eye drops, creams, and over-the-counter medicines.  Any blood disorders you have.  Any surgeries you have had.  Any medical conditions you have.  Whether you are pregnant or may be pregnant. What are the risks? Generally, this is a safe procedure. However, problems may occur, including:  Allergic reaction to dye (contrast) that may be used during the procedure. What happens before the procedure? No specific preparation is needed. You may eat and drink normally. What happens during the procedure?   An IV tube may be inserted into one of your veins.  You may receive contrast through this tube. A contrast is an injection that improves the quality of the pictures from your heart.  A gel will be applied to your chest.  A wand-like tool (transducer) will be moved over your chest. The gel will help to transmit the sound waves from the transducer.  The sound waves will harmlessly bounce off of your heart to allow the heart images to be captured in real-time motion. The images will be recorded on a computer. The procedure may vary among health care providers and hospitals. What happens after the procedure?  You may return to your normal, everyday life, including diet, activities, and medicines, unless your health care provider tells you not to do that. Summary  An echocardiogram is a procedure that uses  painless sound waves (ultrasound) to produce an image of the heart.  Images from an echocardiogram can provide important information  about the size and shape of your heart, heart muscle function, heart valve function, and fluid buildup around your heart.  You do not need to do anything to prepare before this procedure. You may eat and drink normally.  After the echocardiogram is completed, you may return to your normal, everyday life, unless your health care provider tells you not to do that. This information is not intended to replace advice given to you by your health care provider. Make sure you discuss any questions you have with your health care provider. Document Released: 07/07/2000 Document Revised: 10/31/2018 Document Reviewed: 08/12/2016 Elsevier Patient Education  2020 [employer].      Adopting a Healthy Lifestyle.  Know what a healthy weight is for you (roughly BMI <25) and aim to maintain this   Aim for 7+ servings of fruits and vegetables daily   65-80+ fluid ounces of water or unsweet tea for healthy kidneys   Limit to max 1 drink of alcohol per day; avoid smoking/tobacco   Limit animal fats in diet for cholesterol and heart health - choose grass fed whenever available   Avoid highly processed foods, and foods high in saturated/trans fats   Aim for low stress - take time to unwind and care for your mental health   Aim for 150 min of moderate intensity exercise weekly for heart health, and weights twice weekly for bone health   Aim for 7-9 hours of sleep daily   When it comes to diets, agreement about the perfect plan isnt easy to find, even among the experts. Experts at the [facility] [employer] developed an idea known as the Healthy Eating Plate. Just imagine a plate divided into logical, healthy portions.   The emphasis is on diet quality:   Load up on vegetables and fruits - one-half of your plate: Aim for color and variety, and remember that  potatoes dont count.   Go for whole grains - one-quarter of your plate: Whole wheat, barley, wheat berries, quinoa, oats, brown rice, and foods made with them. If you want pasta, go with whole wheat pasta.   Protein power - one-quarter of your plate: Fish, chicken, beans, and nuts are all healthy, versatile protein sources. Limit red meat.   The diet, however, does go beyond the plate, offering a few other suggestions.   Use healthy plant oils, such as olive, canola, soy, corn, sunflower and peanut. Check the labels, and avoid partially hydrogenated oil, which have unhealthy trans fats.   If youre thirsty, drink water. Coffee and tea are good in moderation, but skip sugary drinks and limit milk and dairy products to one or two daily servings.   The type of carbohydrate in the diet is more important than the amount. Some sources of carbohydrates, such as vegetables, fruits, whole grains, and beans-are healthier than others.   Finally, stay active  Signed, Thomasene Ripple, DO  05/08/2019 3:28 PM    [facility]

## 2019-05-08 NOTE — Patient Instructions (Addendum)
Medication Instructions:  Your physician recommends that you continue on your current medications as directed. Please refer to the Current Medication list given to you today.  *If you need a refill on your cardiac medications before your next appointment, please call your pharmacy*  Lab Work: None  If you have labs (blood work) drawn today and your tests are completely normal, you will receive your results only by: [name] MyChart Message (if you have MyChart) OR . A paper copy in the mail If you have any lab test that is abnormal or we need to change your treatment, we will call you to review the results.  Testing/Procedures: You had an EKG today.   Your physician has requested that you have an echocardiogram. Echocardiography is a painless test that uses sound waves to create images of your heart. It provides your doctor with information about the size and shape of your heart and how well your heart's chambers and valves are working. This procedure takes approximately one hour. There are no restrictions for this procedure.  Your physician has recommended that you wear a ZIO monitor. ZIO monitors are medical devices that record the heart's electrical activity. Doctors most often use these monitors to diagnose arrhythmias. Arrhythmias are problems with the speed or rhythm of the heartbeat. The monitor is a small, portable device. You can wear one while you do your normal daily activities. This is usually used to diagnose what is causing palpitations/syncope (passing out). Wear for 3 days.   You have been referred to see an electrophysiologist, Dr. Elberta Fortis, due to atrial fibrillation. You will be contacted to schedule this appointment.   Follow-Up: At [facility], you and your health needs are our priority.  As part of our continuing mission to provide you with exceptional heart care, we have created designated Provider Care Teams.  These Care Teams include your primary Cardiologist (physician)  and Advanced Practice Providers (APPs -  Physician Assistants and Nurse Practitioners) who all work together to provide you with the care you need, when you need it.  Your next appointment:   3 months  The format for your next appointment:   In Person  Provider:   Thomasene Ripple, DO    Echocardiogram An echocardiogram is a procedure that uses painless sound waves (ultrasound) to produce an image of the heart. Images from an echocardiogram can provide important information about:  Signs of coronary artery disease (CAD).  Aneurysm detection. An aneurysm is a weak or damaged part of an artery wall that bulges out from the normal force of blood pumping through the body.  Heart size and shape. Changes in the size or shape of the heart can be associated with certain conditions, including heart failure, aneurysm, and CAD.  Heart muscle function.  Heart valve function.  Signs of a past heart attack.  Fluid buildup around the heart.  Thickening of the heart muscle.  A tumor or infectious growth around the heart valves. Tell a health care provider about:  Any allergies you have.  All medicines you are taking, including vitamins, herbs, eye drops, creams, and over-the-counter medicines.  Any blood disorders you have.  Any surgeries you have had.  Any medical conditions you have.  Whether you are pregnant or may be pregnant. What are the risks? Generally, this is a safe procedure. However, problems may occur, including:  Allergic reaction to dye (contrast) that may be used during the procedure. What happens before the procedure? No specific preparation is needed. You may  eat and drink normally. What happens during the procedure?   An IV tube may be inserted into one of your veins.  You may receive contrast through this tube. A contrast is an injection that improves the quality of the pictures from your heart.  A gel will be applied to your chest.  A wand-like tool  (transducer) will be moved over your chest. The gel will help to transmit the sound waves from the transducer.  The sound waves will harmlessly bounce off of your heart to allow the heart images to be captured in real-time motion. The images will be recorded on a computer. The procedure may vary among health care providers and hospitals. What happens after the procedure?  You may return to your normal, everyday life, including diet, activities, and medicines, unless your health care provider tells you not to do that. Summary  An echocardiogram is a procedure that uses painless sound waves (ultrasound) to produce an image of the heart.  Images from an echocardiogram can provide important information about the size and shape of your heart, heart muscle function, heart valve function, and fluid buildup around your heart.  You do not need to do anything to prepare before this procedure. You may eat and drink normally.  After the echocardiogram is completed, you may return to your normal, everyday life, unless your health care provider tells you not to do that. This information is not intended to replace advice given to you by your health care provider. Make sure you discuss any questions you have with your health care provider. Document Released: 07/07/2000 Document Revised: 10/31/2018 Document Reviewed: 08/12/2016 Elsevier Patient Education  2020 [employer]vier Inc.   Atrial Fibrillation Atrial fibrillation is a type of irregular or rapid heartbeat (arrhythmia). In atrial fibrillation, the top part of the heart (atria) quivers in a chaotic pattern. This makes the heart unable to pump blood normally. Having atrial fibrillation can increase your risk for other health problems, such as:  Blood can pool in the atria and form clots. If a clot travels to the brain, it can cause a stroke.  The heart muscle may weaken from the irregular blood flow. This can cause heart failure. Atrial fibrillation may  start suddenly and stop on its own, or it may become a long-lasting problem. What are the causes? This condition is caused by some heart-related conditions or procedures, including:  High blood pressure. This is the most common cause.  Heart failure.  Heart valve conditions.  Inflammation of the sac that surrounds the heart (pericarditis).  Heart surgery.  Coronary artery disease.  Certain heart rhythm disorders, such as Wolf-Parkinson-White syndrome. Other causes include:  Pneumonia.  Obstructive sleep apnea.  Lung cancer.  Thyroid problems, especially if the thyroid is overactive (hyperthyroidism).  Excessive alcohol or drug use. Sometimes, the cause of this condition is not known. What increases the risk? This condition is more likely to develop in:  Older people.  People who smoke.  People who have diabetes mellitus.  People who are overweight (obese).  Athletes who exercise vigorously.  People who have a family history. What are the signs or symptoms? Symptoms of this condition include:  A feeling that your heart is beating rapidly or irregularly.  A feeling of discomfort or pain in your chest.  Shortness of breath.  Sudden light-headedness or weakness.  Getting tired easily during exercise. In some cases, there are no symptoms. How is this diagnosed? Your health care provider may be able to detect atrial  fibrillation when taking your pulse. If detected, this condition may be diagnosed with:  Electrocardiogram (ECG).  Ambulatory cardiac monitor. This device records your heartbeats for 24 hours or more.  Transthoracic echocardiogram (TTE) to evaluate how blood flows through your heart.  Transesophageal echocardiogram (TEE) to view more detailed images of your heart.  A stress test.  Imaging tests, such as a CT scan or chest X-ray.  Blood tests. How is this treated? This condition may be treated with:  Medicines to slow down the heart rate  or bring the heart's rhythm back to normal.  Medicines to prevent blood clots from forming.  Electrical cardioversion. This delivers a low-energy shock to the heart to reset its rhythm.  Ablation. This procedure destroys the part of the heart tissue that sends abnormal signals.  Left atrial appendage occlusion/excision. This seals off a common place in the atria where blood clots can form (left atrial appendage). The goal of treatment is to prevent blood clots from forming and to keep your heart beating at a normal rate and rhythm. Treatment depends on underlying medical conditions and how you feel when you are experiencing fibrillation. Follow these instructions at home: Medicines  Take over-the counter and prescription medicines only as told by your health care provider.  If your health care provider prescribed a blood-thinning medicine (anticoagulant), take it exactly as told. Taking too much blood-thinning medicine can cause bleeding. Taking too little can enable a blood clot to form and travel to the brain, causing a stroke. Lifestyle      Do not use any products that contain nicotine or tobacco, such as cigarettes and e-cigarettes. If you need help quitting, ask your health care provider.  Do not drink beverages that contain caffeine, such as coffee, soda, and tea.  Follow diet instructions as told by your health care provider.  Exercise regularly as told by your health care provider.  Do not drink alcohol. General instructions  If you have obstructive sleep apnea, manage your condition as told by your health care provider.  Maintain a healthy weight. Do not use diet pills unless your health care provider approves. Diet pills may make heart problems worse.  Keep all follow-up visits as told by your health care provider. This is important. Contact a health care provider if you:  Notice a change in the rate, rhythm, or strength of your heartbeat.  Are taking an  anticoagulant and you notice increased bruising.  Tire more easily when you exercise or exert yourself.  Have a sudden change in weight. Get help right away if you have:   Chest pain, abdominal pain, sweating, or weakness.  Difficulty breathing.  Blood in your vomit, stool (feces), or urine.  Any symptoms of a stroke. "BE FAST" is an easy way to remember the main warning signs of a stroke: ? B - Balance. Signs are dizziness, sudden trouble walking, or loss of balance. ? E - Eyes. Signs are trouble seeing or a sudden change in vision. ? F - Face. Signs are sudden weakness or numbness of the face, or the face or eyelid drooping on one side. ? A - Arms. Signs are weakness or numbness in an arm. This happens suddenly and usually on one side of the body. ? S - Speech. Signs are sudden trouble speaking, slurred speech, or trouble understanding what people say. ? T - Time. Time to call emergency services. Write down what time symptoms started.  Other signs of a stroke, such as: ? A  sudden, severe headache with no known cause. ? Nausea or vomiting. ? Seizure. These symptoms may represent a serious problem that is an emergency. Do not wait to see if the symptoms will go away. Get medical help right away. Call your local emergency services (911 in the [place] Do not drive yourself to the hospital. Summary  Atrial fibrillation is a type of irregular or rapid heartbeat (arrhythmia).  Symptoms include a feeling that your heart is beating fast or irregularly. In some cases, you may not have symptoms.  The condition is treated with medicines to slow down the heart rate or bring the heart's rhythm back to normal. You may also need blood-thinning medicines to prevent blood clots.  Get help right away if you have symptoms or signs of a stroke. This information is not intended to replace advice given to you by your health care provider. Make sure you discuss any questions you have with your health  care provider. Document Released: 07/10/2005 Document Revised: 08/30/2017 Document Reviewed: 08/31/2017 Elsevier Patient Education  2020 [employer]

## 2019-05-16 ENCOUNTER — Ambulatory Visit (HOSPITAL_COMMUNITY): Payer: Self-pay | Attending: Cardiology

## 2019-05-16 ENCOUNTER — Other Ambulatory Visit: Payer: Self-pay

## 2019-05-16 ENCOUNTER — Telehealth: Payer: Self-pay | Admitting: Internal Medicine

## 2019-05-16 DIAGNOSIS — G459 Transient cerebral ischemic attack, unspecified: Secondary | ICD-10-CM | POA: Insufficient documentation

## 2019-05-16 DIAGNOSIS — I480 Paroxysmal atrial fibrillation: Secondary | ICD-10-CM | POA: Insufficient documentation

## 2019-05-16 NOTE — Telephone Encounter (Signed)
Phoned patient's mother Katharine Look (per [facility]). Informed that echo results will be read by physician and we will call her when report is complete likely next week. Gave clarification on office location for upcoming visit with Dr. Curt Bears 05/27/19 and of no visiter policy for [place]. She verbalizes understanding, no further questions or concerns.

## 2019-05-16 NOTE — Telephone Encounter (Signed)
Patient's mother would like a call back once her son's Echo is completed today.  He is getting it done right now at 305pm.

## 2019-05-27 ENCOUNTER — Ambulatory Visit (INDEPENDENT_AMBULATORY_CARE_PROVIDER_SITE_OTHER): Payer: Self-pay | Admitting: Cardiology

## 2019-05-27 ENCOUNTER — Other Ambulatory Visit: Payer: Self-pay

## 2019-05-27 ENCOUNTER — Encounter: Payer: Self-pay | Admitting: Cardiology

## 2019-05-27 VITALS — BP 116/78 | HR 72 | Ht 61.0 in | Wt 141.8 lb

## 2019-05-27 DIAGNOSIS — I480 Paroxysmal atrial fibrillation: Secondary | ICD-10-CM

## 2019-05-27 MED ORDER — FLECAINIDE ACETATE 50 MG PO TABS: 50.0000 mg | ORAL_TABLET | Freq: Two times a day (BID) | ORAL | 3 refills | Status: DC

## 2019-05-27 MED ORDER — DILTIAZEM HCL ER COATED BEADS 120 MG PO CP24: 120.0000 mg | ORAL_CAPSULE | Freq: Every day | ORAL | 3 refills | Status: DC

## 2019-05-27 NOTE — Progress Notes (Addendum)
Electrophysiology Office Note   Date:  05/27/2019   ID:  Robert Koch, DOB 02/27/1986, MRN [id number]  PCP:  Patient, No Pcp Per  Cardiologist:  Tobb Primary Electrophysiologist:  Mamoudou Mulvehill [name], MD    Chief Complaint: AF   History of Present Illness: Robert Koch is a 33 y.o. male who is being seen today for the evaluation of AF at the request of [name]. Presenting today for electrophysiology evaluation.  He has a history significant for atrial fibrillation diagnosed in 2011.  It was stated at the time that he had a concealed pathway.  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  He was put on flecainide.  While on low-dose flecainide he had 1 breakthrough episode of atrial fibrillation but this medication was stopped as it was no longer working.  He was referred to both [facility] and [facility], but due to a lack of insurance, did not get ablation.  Palpitations that last a few minutes at a time.  His last episode was a few days ago.  Otherwise he has been doing well.  EP study by Dr. Johney Frame on 08/24/2009 after he was in the hospital with palpitations.  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  Earliest signal with V pacing was only his catheter.  Ablation was attempted in the area without changes.  It was thought that further ablation at the time would cause heart block and thus he was put on flecainide.  Today, he denies symptoms of palpitations, chest pain, shortness of breath, orthopnea, PND, lower extremity edema, claudication, dizziness, presyncope, syncope, bleeding, or neurologic sequela. The patient is tolerating medications without difficulties.    Past Medical History:  Diagnosis Date  . Concealed accessory pathway   . PAF (paroxysmal atrial fibrillation) (HCC)   . TIA (transient ischemic attack)    Past Surgical History:  Procedure Laterality Date  . CARDIAC SURGERY  2010   reports extra tissue  around heart/Catherization     Current Outpatient Medications  Medication Sig Dispense Refill  . aspirin 81 MG chewable tablet Chew 1 tablet (81 mg total) by mouth daily. 30 tablet 0  . Aspirin-Salicylamide-Caffeine (BC HEADACHE POWDER PO) Take 2 packets by mouth every 6 (six) hours as needed (for pain).     No current facility-administered medications for this visit.     Allergies:   Amoxicillin   Social History:  The patient  reports that he has quit smoking. His smoking use included cigarettes. He has a 0.10 pack-year smoking history. He has never used smokeless tobacco. He reports current alcohol use. He reports that he does not use drugs.   Family History:  The patient's family history includes Atrial fibrillation in his mother; COPD in his maternal grandmother; Diabetes in his father; Heart disease in his maternal grandmother; Hypertension in his father, maternal grandmother, and mother; Sickle cell anemia in his maternal grandmother.    ROS:  Please see the history of present illness.   Otherwise, review of systems is positive for none.   All other systems are reviewed and negative.    PHYSICAL EXAM: VS:  BP 116/78   Pulse 72   Ht 5\' 1"  (1.549 m)   Wt 141 lb 12.8 oz (64.3 kg)   SpO2 97%   BMI 26.79 kg/m  , BMI Body mass index is 26.79 kg/m. GEN: Well nourished, well developed, in no acute distress  HEENT: normal  Neck: no JVD, carotid bruits, or masses Cardiac: RRR; no murmurs, rubs, or gallops,no edema  Respiratory:  clear to auscultation bilaterally, normal work of breathing GI: soft, nontender, nondistended, + BS MS: no deformity or atrophy  Skin: warm and dry Neuro:  Strength and sensation are intact Psych: euthymic mood, full affect  EKG:  EKG is not ordered today. Personal review of the ekg ordered 05/08/19 shows sinus rhythm, short PR, rate 74  Recent Labs: 05/06/2019: ALT 109; BUN 16; Creatinine, Ser 1.00; Hemoglobin 14.6; Platelets 141; Potassium 3.9;  Sodium 137    Lipid Panel  No results found for: CHOL, TRIG, HDL, CHOLHDL, VLDL, LDLCALC, LDLDIRECT   Wt Readings from Last 3 Encounters:  05/27/19 141 lb 12.8 oz (64.3 kg)  05/08/19 144 lb (65.3 kg)  05/06/19 141 lb 15.6 oz (64.4 kg)      Other studies Reviewed: Additional studies/ records that were reviewed today include: TTE 05/16/19  Review of the above records today demonstrates:   1. Left ventricular ejection fraction, by visual estimation, is 60 to 65%. The left ventricle has normal function. Normal left ventricular size. There is no left ventricular hypertrophy. Normal wall motion.  2. Global right ventricle has normal systolic function.The right ventricular size is normal. No increase in right ventricular wall thickness.  3. Left atrial size was mildly dilated.  4. Right atrial size was normal.  5. The mitral valve is normal in structure. Trace mitral valve regurgitation. No evidence of mitral stenosis.  6. The tricuspid valve is normal in structure. Tricuspid valve regurgitation was not visualized by color flow Doppler.  7. The aortic valve is tricuspid Aortic valve regurgitation was not visualized by color flow Doppler. Structurally normal aortic valve, with no evidence of sclerosis or stenosis.  8. TR signal is inadequate for assessing pulmonary artery systolic pressure.  9. The inferior vena cava is normal in size with greater than 50% respiratory variability, suggesting right atrial pressure of 3 mmHg.  Monitor 05/08/19 personally reviewed   ASSESSMENT AND PLAN:  1.  Paroxysmal atrial fibrillation: Sinus rhythm today.  He has continued to have palpitations.  We Shann Lewellyn not plan for anticoagulation at this point.  We Baylon Santelli start him on both flecainide and diltiazem for rhythm control.  This patients CHA2DS2-VASc Score and unadjusted Ischemic Stroke Rate (% per year) is equal to 0.2 % stroke rate/year from a score of 0  Above score calculated as 1 point each if present  [CHF, HTN, DM, Vascular=MI/PAD/Aortic Plaque, Age if 65-74, or Male] Above score calculated as 2 points each if present [Age > 75, or Stroke/TIA/TE]  2.  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  Current medicines are reviewed at length with the patient today.   The patient does not have concerns regarding his medicines.  The following changes were made today: Start flecainide, diltiazem  Labs/ tests ordered today include: none No orders of the defined types were placed in this encounter.    Disposition:   FU with Azalie Harbeck 3 months  Signed, Zaccary Creech Meredith Leeds, MD  05/27/2019 10:23 AM     [facility] [address] [place] [address] [phone] (office) [phone] (fax)

## 2019-05-27 NOTE — Patient Instructions (Addendum)
Medication Instructions:  Your physician has recommended you make the following change in your medication:  1. START Flecainide 50 mg twice a day 2. START Diltiazem 120 mg once a day  * If you need a refill on your cardiac medications before your next appointment, please call your pharmacy.   Labwork: None ordered  Testing/Procedures: None ordered  Follow-Up: You are scheduled for a nurse EKG visit on 06/13/19 @ 11:45 am  At [facility], you and your health needs are our priority.  As part of our continuing mission to provide you with exceptional heart care, we have created designated Provider Care Teams.  These Care Teams include your primary Cardiologist (physician) and Advanced Practice Providers (APPs -  Physician Assistants and Nurse Practitioners) who all work together to provide you with the care you need, when you need it.  You will need a follow up appointment in 3 months.  Please call our office 2 months in advance to schedule this appointment.  You may see Dr Curt Bears or one of the following Advanced Practice Providers on your designated Care Team:    Chanetta Marshall, NP  Tommye Standard, PA-C  Oda Kilts, [place]  Thank you for choosing [facility]!!   Trinidad Curet, RN [phone]  Any Other Special Instructions Will Be Listed Below (If Applicable).  Flecainide tablets What is this medicine? FLECAINIDE (FLEK a nide) is an antiarrhythmic drug. This medicine is used to prevent irregular heart rhythm. It can also slow down fast heartbeats called tachycardia. This medicine may be used for other purposes; ask your health care provider or pharmacist if you have questions. COMMON BRAND NAME(S): Tambocor What should I tell my health care provider before I take this medicine? They need to know if you have any of these conditions: abnormal levels of potassium in the blood heart disease including heart rhythm and heart rate problems kidney or liver disease recent heart  attack an unusual or allergic reaction to flecainide, local anesthetics, other medicines, foods, dyes, or preservatives pregnant or trying to get pregnant breast-feeding How should I use this medicine? Take this medicine by mouth with a glass of water. Follow the directions on the prescription label. You can take this medicine with or without food. Take your doses at regular intervals. Do not take your medicine more often than directed. Do not stop taking this medicine suddenly. This may cause serious, heart-related side effects. If your doctor wants you to stop the medicine, the dose may be slowly lowered over time to avoid any side effects. Talk to your pediatrician regarding the use of this medicine in children. While this drug may be prescribed for children as young as 1 year of age for selected conditions, precautions do apply. Overdosage: If you think you have taken too much of this medicine contact a poison control center or emergency room at once. NOTE: This medicine is only for you. Do not share this medicine with others. What if I miss a dose? If you miss a dose, take it as soon as you can. If it is almost time for your next dose, take only that dose. Do not take double or extra doses. What may interact with this medicine? Do not take this medicine with any of the following medications: amoxapine arsenic trioxide certain antibiotics like clarithromycin, erythromycin, gatifloxacin, gemifloxacin, levofloxacin, moxifloxacin, sparfloxacin, or troleandomycin certain antidepressants called tricyclic antidepressants like amitriptyline, imipramine, or nortriptyline certain medicines to control heart rhythm like disopyramide, encainide, moricizine, procainamide, propafenone, and quinidine cisapride  delavirdine droperidol haloperidol hawthorn imatinib levomethadyl maprotiline medicines for malaria like chloroquine and halofantrine pentamidine phenothiazines like chlorpromazine,  mesoridazine, prochlorperazine, thioridazine pimozide quinine ranolazine ritonavir sertindole This medicine may also interact with the following medications: cimetidine dofetilide medicines for angina or high blood pressure medicines to control heart rhythm like amiodarone and digoxin ziprasidone This list may not describe all possible interactions. Give your health care provider a list of all the medicines, herbs, non-prescription drugs, or dietary supplements you use. Also tell them if you smoke, drink alcohol, or use illegal drugs. Some items may interact with your medicine. What should I watch for while using this medicine? Visit your doctor or health care professional for regular checks on your progress. Because your condition and the use of this medicine carries some risk, it is a good idea to carry an identification card, necklace or bracelet with details of your condition, medications and doctor or health care professional. Check your blood pressure and pulse rate regularly. Ask your health care professional what your blood pressure and pulse rate should be, and when you should contact him or her. Your doctor or health care professional also may schedule regular blood tests and electrocardiograms to check your progress. You may get drowsy or dizzy. Do not drive, use machinery, or do anything that needs mental alertness until you know how this medicine affects you. Do not stand or sit up quickly, especially if you are an older patient. This reduces the risk of dizzy or fainting spells. Alcohol can make you more dizzy, increase flushing and rapid heartbeats. Avoid alcoholic drinks. What side effects may I notice from receiving this medicine? Side effects that you should report to your doctor or health care professional as soon as possible: chest pain, continued irregular heartbeats difficulty breathing swelling of the legs or feet trembling, shaking unusually weak or tired Side effects  that usually do not require medical attention (report to your doctor or health care professional if they continue or are bothersome): blurred vision constipation headache nausea, vomiting stomach pain This list may not describe all possible side effects. Call your doctor for medical advice about side effects. You may report side effects to FDA at 1-800-FDA-1088. Where should I keep my medicine? Keep out of the reach of children. Store at room temperature between 15 and 30 degrees C (59 and 86 degrees F). Protect from light. Keep container tightly closed. Throw away any unused medicine after the expiration date. NOTE: This sheet is a summary. It may not cover all possible information. If you have questions about this medicine, talk to your doctor, pharmacist, or health care provider.  2020 Elsevier/Gold Standard (2018-07-01 11:41:38)     Diltiazem tablets What is this medicine? DILTIAZEM (dil TYE a zem) is a calcium-channel blocker. It affects the amount of calcium found in your heart and muscle cells. This relaxes your blood vessels, which can reduce the amount of work the heart has to do. This medicine is used to treat chest pain caused by angina. This medicine may be used for other purposes; ask your health care provider or pharmacist if you have questions. COMMON BRAND NAME(S): Cardizem What should I tell my health care provider before I take this medicine? They need to know if you have any of these conditions:  heart problems, low blood pressure, irregular heartbeat  liver disease  previous heart attack  an unusual or allergic reaction to diltiazem, other medicines, foods, dyes, or preservatives  pregnant or trying to get pregnant  breast-feeding  How should I use this medicine? Take this medicine by mouth with a glass of water. Follow the directions on the prescription label. Do not cut, crush or chew this medicine. This medicine is usually taken before meals and at bedtime.  Take your doses at regular intervals. Do not take your medicine more often then directed. Do not stop taking except on the advice of your doctor or health care professional. Talk to your pediatrician regarding the use of this medicine in children. Special care may be needed. Overdosage: If you think you have taken too much of this medicine contact a poison control center or emergency room at once. NOTE: This medicine is only for you. Do not share this medicine with others. What if I miss a dose? If you miss a dose, take it as soon as you can. If it is almost time for your next dose, take only that dose. Do not take double or extra doses. What may interact with this medicine? Do not take this medicine with any of the following:  cisapride  hawthorn  pimozide  ranolazine  red yeast rice This medicine may also interact with the following medications:  buspirone  carbamazepine  cimetidine  cyclosporine  digoxin  local anesthetics or general anesthetics  lovastatin  medicines for anxiety or difficulty sleeping like midazolam and triazolam  medicines for high blood pressure or heart problems  quinidine  rifampin, rifabutin, or rifapentine This list may not describe all possible interactions. Give your health care provider a list of all the medicines, herbs, non-prescription drugs, or dietary supplements you use. Also tell them if you smoke, drink alcohol, or use illegal drugs. Some items may interact with your medicine. What should I watch for while using this medicine? Check your blood pressure and pulse rate regularly. Ask your doctor or health care professional what your blood pressure and pulse rate should be and when you should contact him or her. You may feel dizzy or lightheaded. Do not drive, use machinery, or do anything that needs mental alertness until you know how this medicine affects you. To reduce the risk of dizzy or fainting spells, do not sit or stand up  quickly, especially if you are an older patient. Alcohol can make you more dizzy or increase flushing and rapid heartbeats. Avoid alcoholic drinks. What side effects may I notice from receiving this medicine? Side effects that you should report to your doctor or health care professional as soon as possible:  allergic reactions like skin rash, itching or hives, swelling of the face, lips, or tongue  confusion, mental depression  feeling faint or lightheaded, falls  pinpoint red spots on the skin  redness, blistering, peeling or loosening of the skin, including inside the mouth  slow, irregular heartbeat  swelling of the ankles, feet  unusual bleeding or bruising Side effects that usually do not require medical attention (report to your doctor or health care professional if they continue or are bothersome):  change in sex drive or performance  constipation or diarrhea  flushing of the face  headache  nausea, vomiting  tired or weak  trouble sleeping This list may not describe all possible side effects. Call your doctor for medical advice about side effects. You may report side effects to FDA at 1-800-FDA-1088. Where should I keep my medicine? Keep out of the reach of children. Store at room temperature between 20 and 25 degrees C (68 and 77 degrees F). Protect from light. Keep container tightly closed. Throw away  any unused medicine after the expiration date. NOTE: This sheet is a summary. It may not cover all possible information. If you have questions about this medicine, talk to your doctor, pharmacist, or health care provider.  2020 Elsevier/Gold Standard (2013-06-23 10:54:31)

## 2019-06-11 ENCOUNTER — Telehealth: Payer: Self-pay | Admitting: *Deleted

## 2019-06-11 NOTE — Telephone Encounter (Signed)
Telephone call to patient.Left message that monitor was normal and to call with any questions. 

## 2019-06-11 NOTE — Telephone Encounter (Signed)
-----   Message from Berniece Salines, DO sent at 06/01/2019  3:51 PM EST ----- Normal study- please notify patient. Please ask him when is her EP visit schedule for.

## 2019-06-13 ENCOUNTER — Ambulatory Visit (INDEPENDENT_AMBULATORY_CARE_PROVIDER_SITE_OTHER): Payer: Self-pay | Admitting: *Deleted

## 2019-06-13 ENCOUNTER — Other Ambulatory Visit: Payer: Self-pay

## 2019-06-13 DIAGNOSIS — I480 Paroxysmal atrial fibrillation: Secondary | ICD-10-CM

## 2019-06-13 DIAGNOSIS — Z79899 Other long term (current) drug therapy: Secondary | ICD-10-CM

## 2019-06-13 NOTE — Progress Notes (Signed)
1.  Reason for visit: EKG post Flecainide start  2.  Name of MD requesting visit:  Camnitz  3. H&P:  See epic  4.  ROS related to problem:  See epic  5.  Assessment and plan per MD:  EKG shows NSR, HR 73, PR 60ms, QRS 84ms. Pt then reports he has not started this medication yet. Informed that I will follow up with him after discussing further with Dr. Curt Bears.

## 2019-09-02 ENCOUNTER — Ambulatory Visit: Payer: Self-pay | Admitting: Cardiology

## 2019-09-02 NOTE — Progress Notes (Deleted)
Electrophysiology Office Note   Date:  09/02/2019   ID:  Robert Koch, DOB July 14, 1986, MRN [id number]  PCP:  Patient, No Pcp Per  Cardiologist:  Tobb Primary Electrophysiologist:  Santiaga Butzin [name], MD    Chief Complaint: AF   History of Present Illness: Robert Koch is a 34 y.o. male who is being seen today for the evaluation of AF at the request of [name]. Presenting today for electrophysiology evaluation.  He has a history significant for atrial fibrillation diagnosed in 2011.  It was stated at the time that he had a concealed pathway.  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  He was put on flecainide.  While on low-dose flecainide he had 1 breakthrough episode of atrial fibrillation but this medication was stopped as it was no longer working.  He was referred to both [facility] and [facility], but due to a lack of insurance, did not get ablation.  Palpitations that last a few minutes at a time.  His last episode was a few days ago.  Otherwise he has been doing well.  EP study by Dr. Johney Frame on 08/24/2009 after he was in the hospital with palpitations.  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  Earliest signal with V pacing was only HIS catheter.  Ablation was attempted in the area without changes.  It was thought that further ablation at the time would cause heart block and thus he was put on flecainide.  Today, denies symptoms of palpitations, chest pain, shortness of breath, orthopnea, PND, lower extremity edema, claudication, dizziness, presyncope, syncope, bleeding, or neurologic sequela. The patient is tolerating medications without difficulties. ***    Past Medical History:  Diagnosis Date  . Concealed accessory pathway   . PAF (paroxysmal atrial fibrillation) (HCC)   . TIA (transient ischemic attack)    Past Surgical History:  Procedure Laterality Date  . CARDIAC SURGERY  2010   reports extra tissue  around heart/Catherization     Current Outpatient Medications  Medication Sig Dispense Refill  . Aspirin-Salicylamide-Caffeine (BC HEADACHE POWDER PO) Take 2 packets by mouth every 6 (six) hours as needed (for pain).    [name] diltiazem (CARDIZEM CD) 120 MG 24 hr capsule Take 1 capsule (120 mg total) by mouth daily. 30 capsule 3  . flecainide (TAMBOCOR) 50 MG tablet Take 1 tablet (50 mg total) by mouth 2 (two) times daily. 60 tablet 3   No current facility-administered medications for this visit.    Allergies:   Amoxicillin   Social History:  The patient  reports that he has quit smoking. His smoking use included cigarettes. He has a 0.10 pack-year smoking history. He has never used smokeless tobacco. He reports current alcohol use. He reports that he does not use drugs.   Family History:  The patient's family history includes Atrial fibrillation in his mother; COPD in his maternal grandmother; Diabetes in his father; Heart disease in his maternal grandmother; Hypertension in his father, maternal grandmother, and mother; Sickle cell anemia in his maternal grandmother.    ROS:  Please see the history of present illness.   Otherwise, review of systems is positive for ***.   All other systems are reviewed and negative.   PHYSICAL EXAM: VS:  There were no vitals taken for this visit. , BMI There is no height or weight on file to calculate BMI. GEN: Well nourished, well developed, in no acute  distress  HEENT: normal  Neck: no JVD, carotid bruits, or masses Cardiac: ***RRR; no murmurs, rubs, or gallops,no edema  Respiratory:  clear to auscultation bilaterally, normal work of breathing GI: soft, nontender, nondistended, + BS MS: no deformity or atrophy  Skin: warm and dry Neuro:  Strength and sensation are intact Psych: euthymic mood, full affect  EKG:  EKG {ACTION; IS/IS [id number]} ordered today. Personal review of the ekg ordered *** shows ***   Recent Labs: 05/06/2019: ALT 109; BUN  16; Creatinine, Ser 1.00; Hemoglobin 14.6; Platelets 141; Potassium 3.9; Sodium 137    Lipid Panel  No results found for: CHOL, TRIG, HDL, CHOLHDL, VLDL, LDLCALC, LDLDIRECT   Wt Readings from Last 3 Encounters:  05/27/19 141 lb 12.8 oz (64.3 kg)  05/08/19 144 lb (65.3 kg)  05/06/19 141 lb 15.6 oz (64.4 kg)      Other studies Reviewed: Additional studies/ records that were reviewed today include: TTE 05/16/19  Review of the above records today demonstrates:   1. Left ventricular ejection fraction, by visual estimation, is 60 to 65%. The left ventricle has normal function. Normal left ventricular size. There is no left ventricular hypertrophy. Normal wall motion.  2. Global right ventricle has normal systolic function.The right ventricular size is normal. No increase in right ventricular wall thickness.  3. Left atrial size was mildly dilated.  4. Right atrial size was normal.  5. The mitral valve is normal in structure. Trace mitral valve regurgitation. No evidence of mitral stenosis.  6. The tricuspid valve is normal in structure. Tricuspid valve regurgitation was not visualized by color flow Doppler.  7. The aortic valve is tricuspid Aortic valve regurgitation was not visualized by color flow Doppler. Structurally normal aortic valve, with no evidence of sclerosis or stenosis.  8. TR signal is inadequate for assessing pulmonary artery systolic pressure.  9. The inferior vena cava is normal in size with greater than 50% respiratory variability, suggesting right atrial pressure of 3 mmHg.  Monitor 05/08/19 personally reviewed   ASSESSMENT AND PLAN:  1.  Paroxysmal atrial fibrillation: CHA2DS2-VASc of 0.  Currently on flecainide and diltiazem. ***  2.  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  Current medicines are reviewed at length with the patient today.   The patient does not have concerns regarding his medicines.  The following changes  were made today: ***  Labs/ tests ordered today include: none No orders of the defined types were placed in this encounter.    Disposition:   FU with Robert Koch *** months  Signed, Rabecka Brendel Meredith Leeds, MD  09/02/2019 8:50 AM     [place] [address] [phone] (office) [phone] (fax)

## 2019-09-16 ENCOUNTER — Ambulatory Visit (INDEPENDENT_AMBULATORY_CARE_PROVIDER_SITE_OTHER): Payer: Self-pay | Admitting: Cardiology

## 2019-09-16 ENCOUNTER — Encounter: Payer: Self-pay | Admitting: Cardiology

## 2019-09-16 ENCOUNTER — Other Ambulatory Visit: Payer: Self-pay

## 2019-09-16 VITALS — BP 122/76 | HR 94 | Ht 61.0 in | Wt 141.6 lb

## 2019-09-16 DIAGNOSIS — I480 Paroxysmal atrial fibrillation: Secondary | ICD-10-CM

## 2019-09-16 MED ORDER — DILTIAZEM HCL ER COATED BEADS 120 MG PO CP24: 120.0000 mg | ORAL_CAPSULE | Freq: Every day | ORAL | 6 refills | Status: AC

## 2019-09-16 MED ORDER — FLECAINIDE ACETATE 50 MG PO TABS: 50.0000 mg | ORAL_TABLET | Freq: Two times a day (BID) | ORAL | 6 refills | Status: AC

## 2019-09-16 NOTE — Progress Notes (Signed)
Electrophysiology Office Note   Date:  09/16/2019   ID:  Robert Koch 1986-02-04, MRN [id number]  PCP:  Patient, No Pcp Per  Cardiologist:  Robert Koch:  Robert Koch Robert Loa, MD    Chief Complaint: AF   History of Present Illness: Robert Koch is a 34 y.o. male who is being seen today for the evaluation of AF at the request of Robert Koch. Presenting today for electrophysiology evaluation.  He has a history significant for atrial fibrillation diagnosed in 2011.  It was stated at the time that he had a concealed pathway.  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  He was put on flecainide.  While on low-dose flecainide he had 1 breakthrough episode of atrial fibrillation but this medication was stopped as it was no longer working.  He was referred to both [facility] and [facility], but due to a lack of insurance, did not get ablation.  Palpitations that last a few minutes at a time.  His last episode was a few days ago.  Otherwise he has been doing well.  EP study by Robert Koch on 08/24/2009 after he was in the hospital with palpitations.  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  Earliest signal with V pacing was only his catheter.  Ablation was attempted in the area without changes.  It was thought that further ablation at the time would cause heart block and thus he was put on flecainide.  Today, denies symptoms of palpitations, chest pain, shortness of breath, orthopnea, PND, lower extremity edema, claudication, dizziness, presyncope, syncope, bleeding, or neurologic sequela. The patient is tolerating medications without difficulties.  Overall he is doing well.  He has no chest pain or shortness of breath.  He did have 2 episodes of tachycardia over the weekend.  They lasted for approximately a minute at a time.  He was moving furniture in the time.  He took some Tylenol after the second episode which  he feels helped his palpitations.  That he did not pick up his flecainide or diltiazem.  He has not been taking either of these medications.   Past Medical History:  Diagnosis Date   Concealed accessory pathway    PAF (paroxysmal atrial fibrillation) (HCC)    TIA (transient ischemic attack)    Past Surgical History:  Procedure Laterality Date   CARDIAC SURGERY  2010   reports extra tissue around heart/Catherization     Current Outpatient Medications  Medication Sig Dispense Refill   Aspirin-Salicylamide-Caffeine (BC HEADACHE POWDER PO) Take 2 packets by mouth every 6 (six) hours as needed (for pain).     diltiazem (CARDIZEM CD) 120 MG 24 hr capsule Take 1 capsule (120 mg total) by mouth daily. (Patient not taking: Reported on 09/16/2019) 30 capsule 3   flecainide (TAMBOCOR) 50 MG tablet Take 1 tablet (50 mg total) by mouth 2 (two) times daily. (Patient not taking: Reported on 09/16/2019) 60 tablet 3   No current facility-administered medications for this visit.    Allergies:   Amoxicillin   Social History:  The patient  reports that he has quit smoking. His smoking use included cigarettes. He has a 0.10 pack-year smoking history. He has never used smokeless tobacco. He reports current alcohol use. He reports that he does not use drugs.   Family History:  The patient's family history includes Atrial fibrillation in his mother; COPD in his maternal grandmother;  Diabetes in his father; Heart disease in his maternal grandmother; Hypertension in his father, maternal grandmother, and mother; Sickle cell anemia in his maternal grandmother.    ROS:  Please see the history of present illness.   Otherwise, review of systems is positive for none.   All other systems are reviewed and negative.   PHYSICAL EXAM: VS:  BP 122/76    Pulse 94    Ht 5\' 1"  (1.549 m)    Wt 141 lb 9.6 oz (64.2 kg)    SpO2 97%    BMI 26.76 kg/m  , BMI Body mass index is 26.76 kg/m. GEN: Well nourished, well  developed, in no acute distress  HEENT: normal  Neck: no JVD, carotid bruits, or masses Cardiac: RRR; no murmurs, rubs, or gallops,no edema  Respiratory:  clear to auscultation bilaterally, normal work of breathing GI: soft, nontender, nondistended, + BS MS: no deformity or atrophy  Skin: warm and dry Neuro:  Strength and sensation are intact Psych: euthymic mood, full affect  EKG:  EKG is ordered today. Personal review of the ekg ordered shows sinus rhythm, short PR, rate 86  Recent Labs: 05/06/2019: ALT 109; BUN 16; Creatinine, Ser 1.00; Hemoglobin 14.6; Platelets 141; Potassium 3.9; Sodium 137    Lipid Panel  No results found for: CHOL, TRIG, HDL, CHOLHDL, VLDL, LDLCALC, LDLDIRECT   Wt Readings from Last 3 Encounters:  09/16/19 141 lb 9.6 oz (64.2 kg)  05/27/19 141 lb 12.8 oz (64.3 kg)  05/08/19 144 lb (65.3 kg)      Other studies Reviewed: Additional studies/ records that were reviewed today include: TTE 05/16/19  Review of the above records today demonstrates:   1. Left ventricular ejection fraction, by visual estimation, is 60 to 65%. The left ventricle has normal function. Normal left ventricular size. There is no left ventricular hypertrophy. Normal wall motion.  2. Global right ventricle has normal systolic function.The right ventricular size is normal. No increase in right ventricular wall thickness.  3. Left atrial size was mildly dilated.  4. Right atrial size was normal.  5. The mitral valve is normal in structure. Trace mitral valve regurgitation. No evidence of mitral stenosis.  6. The tricuspid valve is normal in structure. Tricuspid valve regurgitation was not visualized by color flow Doppler.  7. The aortic valve is tricuspid Aortic valve regurgitation was not visualized by color flow Doppler. Structurally normal aortic valve, with no evidence of sclerosis or stenosis.  8. TR signal is inadequate for assessing pulmonary artery systolic pressure.  9. The  inferior vena cava is normal in size with greater than 50% respiratory variability, suggesting right atrial pressure of 3 mmHg.  Monitor 05/08/19 personally reviewed   ASSESSMENT AND PLAN:  1.  Paroxysmal atrial fibrillation: Currently not anticoagulated due to low stroke risk.  CHA2DS2-VASc of 0.  He is continued to have episodes.  He is previously prescribed both flecainide and diltiazem but did not pick up these medications.  We Siaosi Alter represcribe them to see if they have an effect.  2.  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  We Bridgette Wolden work to treat his ventricular preexcitation and tachycardia with flecainide and diltiazem.  Current medicines are reviewed at length with the patient today.   The patient does not have concerns regarding his medicines.  The following changes were made today: None  Labs/ tests ordered today include: none Orders Placed This  Encounter  Procedures   EKG 12-Lead     Disposition:   FU with Coral Timme 6 months  Signed, Madilyne Tadlock Robert Loa, MD  09/16/2019 10:44 AM     [facility] [address] [place] [address] [phone] (office) [phone] (fax)

## 2019-09-16 NOTE — Addendum Note (Signed)
Addended by: Baird Lyons on: 09/16/2019 10:49 AM   Modules accepted: Orders

## 2021-04-17 IMAGING — CT CT HEAD CODE STROKE
4 series · 17 of 47 positions shown, 19 images · non-contrast
Comparison: None.

CLINICAL DATA: Code stroke. Left-sided numbness. Last seen normal
9770 hours

EXAM:
CT HEAD WITHOUT CONTRAST
TECHNIQUE: Contiguous axial images were obtained from the base of the skull
through the vertex without intravenous contrast.

[Series 3: head wo · axial · 0.42mm/px · z∈[-636,-496]mm · 7 of 38 slices shown, 9 images]
[im 5/38  brain]
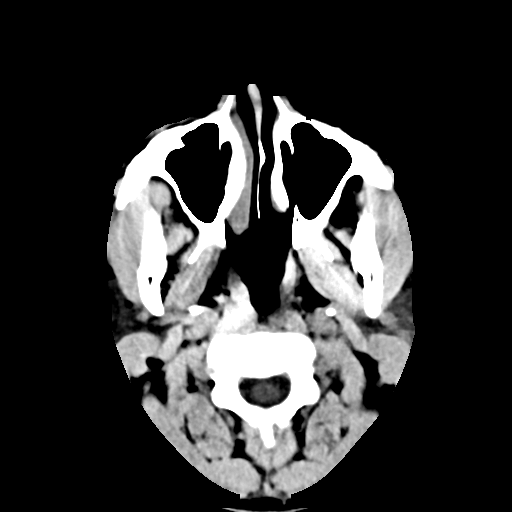
[im 5/38  bone]
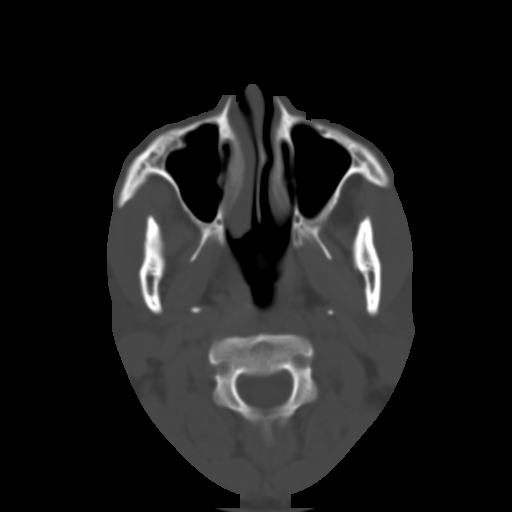
[im 10/38  brain]
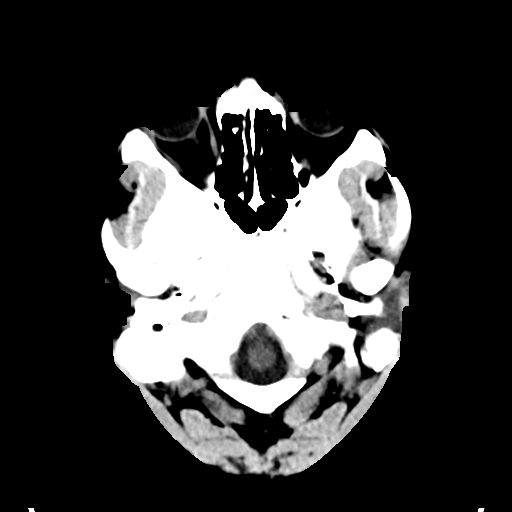
[im 14/38  brain]
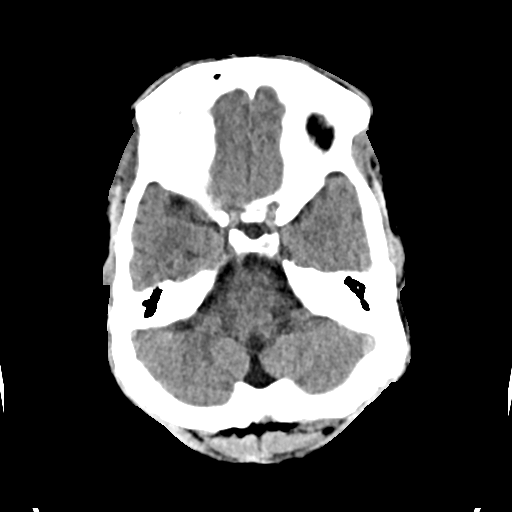
[im 19/38  brain]
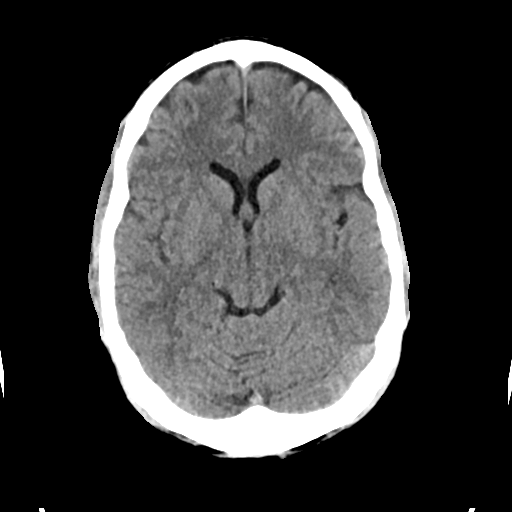
[im 24/38  brain]
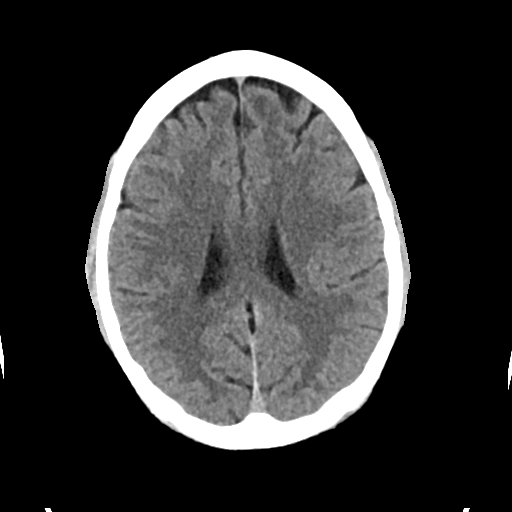
[im 24/38  bone]
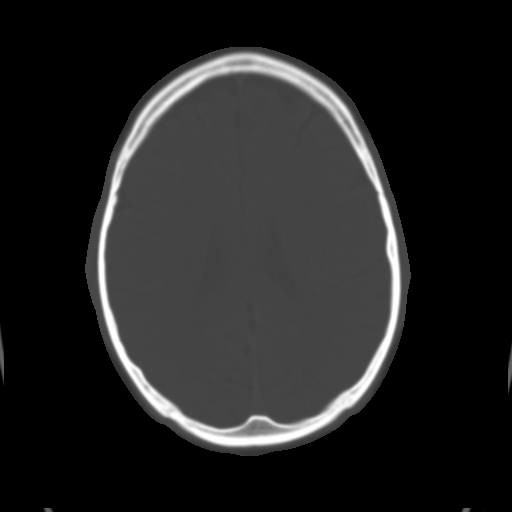
[im 28/38  brain]
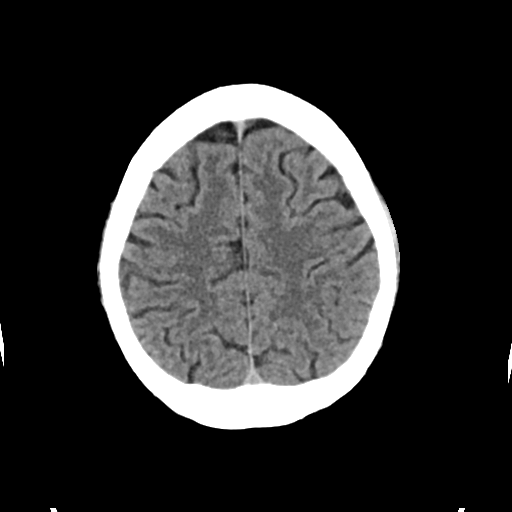
[im 33/38  brain]
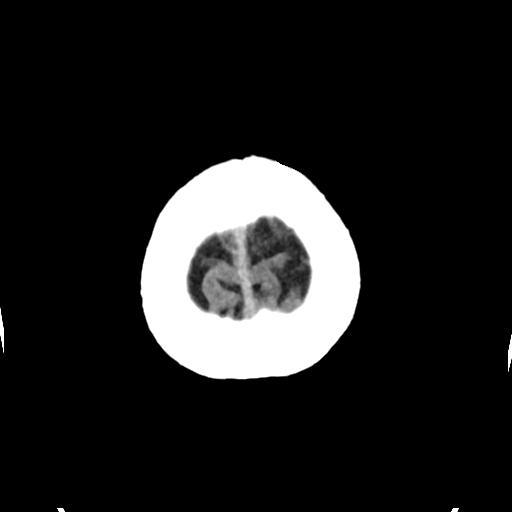

[Series 4: head bone · axial · 0.42mm/px · z∈[-638,-572]mm · 4 of 95 slices shown]
[im 10/95  bone]
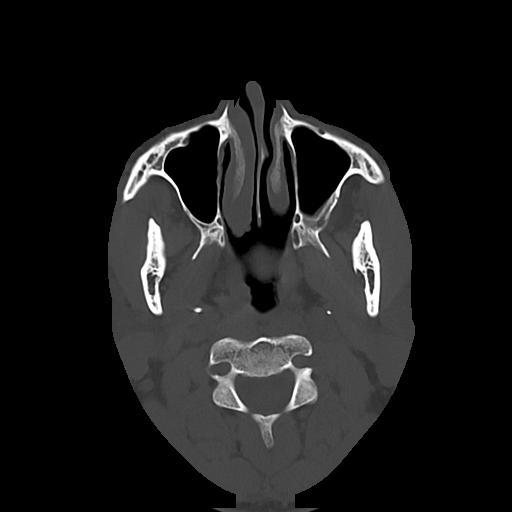
[im 19/95  bone]
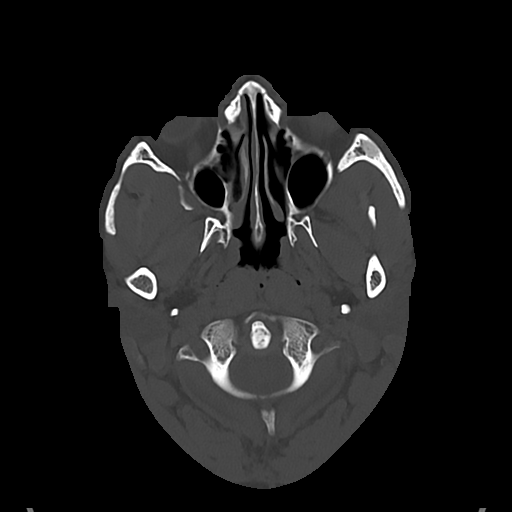
[im 29/95  bone]
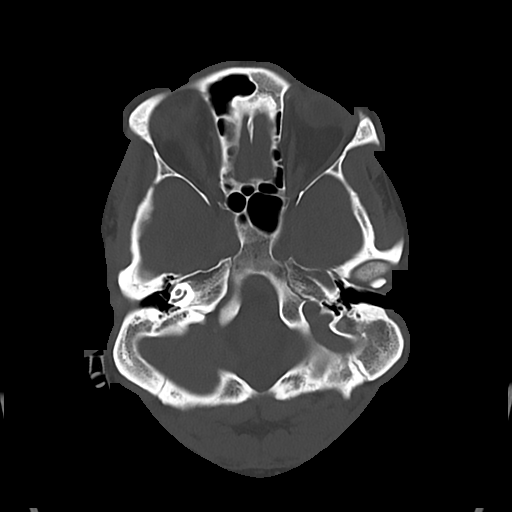
[im 43/95  bone]
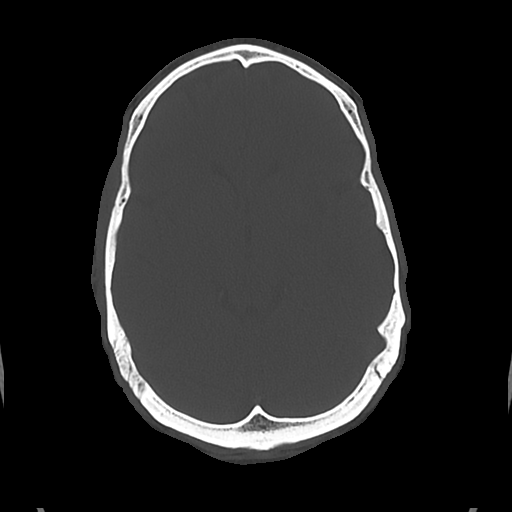

[Series 5: cor soft · coronal · 0.30mm/px · 3 of 68 slices shown]
[im 23/68  brain]
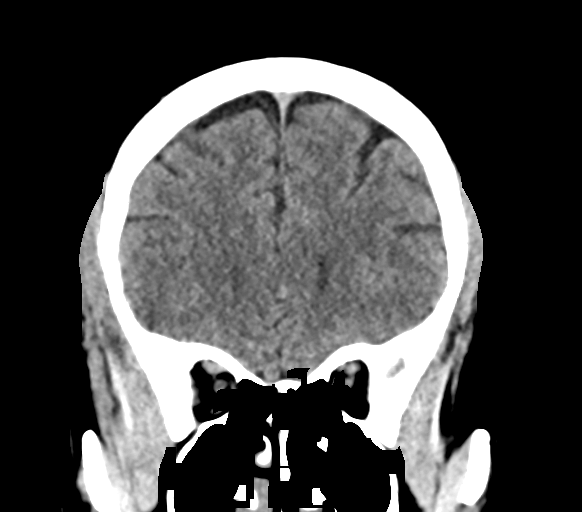
[im 30/68  brain]
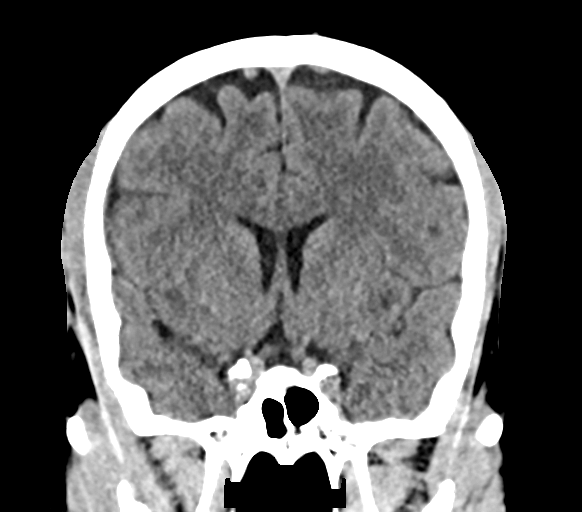
[im 38/68  brain]
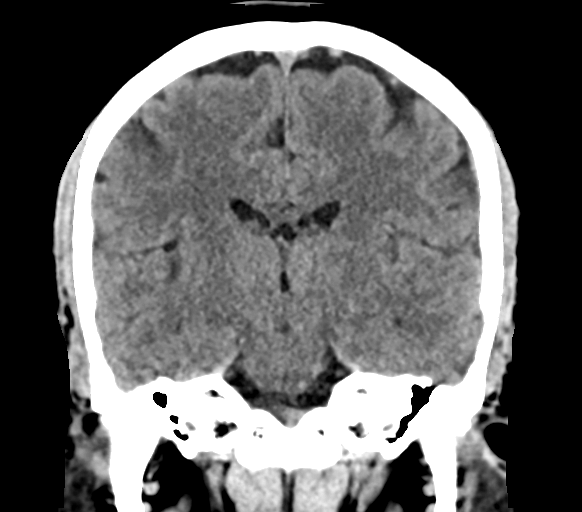

[Series 6: sag soft · sagittal · 0.33mm/px · 3 of 58 slices shown]
[im 20/58  brain]
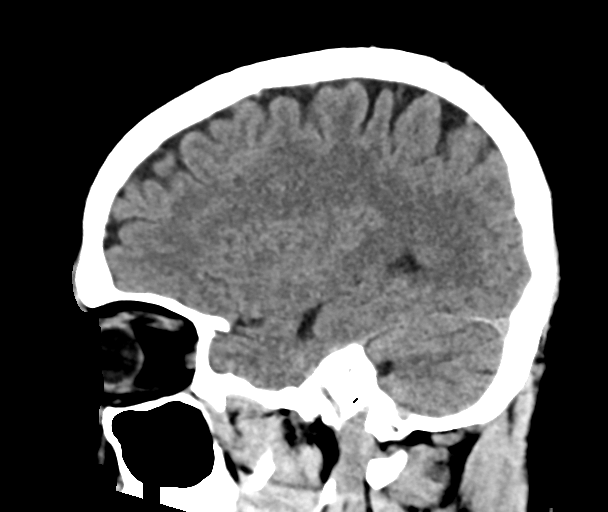
[im 29/58  brain]
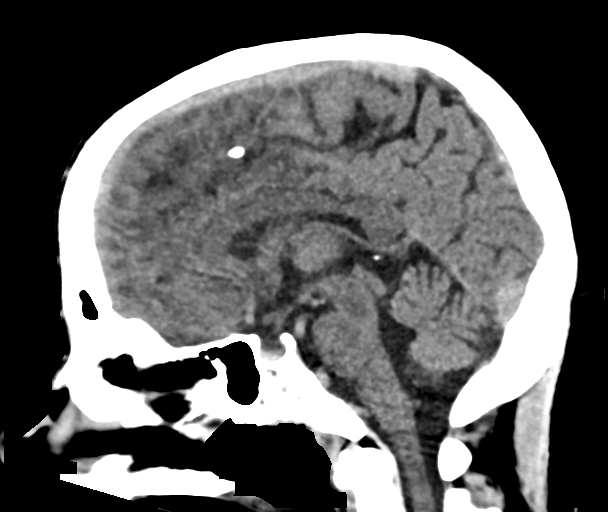
[im 39/58  brain]
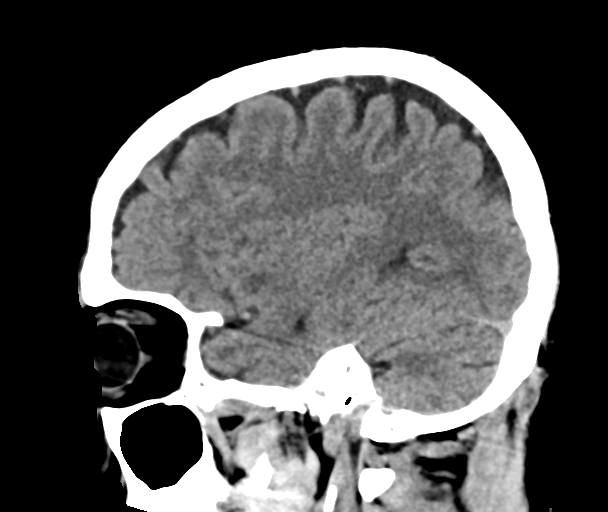

[17 of 47 positions shown; findings below may reference images not displayed]

FINDINGS: Brain: Normal appearance without evidence of atrophy, old or acute
infarction, mass lesion, hemorrhage, hydrocephalus or extra-axial
collection.

Vascular: No abnormal vascular finding.

Skull: Normal

Sinuses/Orbits: Clear/normal

Other: None

ASPECTS (Alberta Stroke Program Early CT Score)

- Ganglionic level infarction (caudate, lentiform nuclei, internal
capsule, insula, M1-M3 cortex): 7

- Supraganglionic infarction (M4-M6 cortex): 3

Total score (0-10 with 10 being normal): 10
IMPRESSION: 1. Normal head CT
2. ASPECTS is 10.
3. These results were communicated to Dr. Don Lolito at [DATE] pmon
05/06/2019by text page via the [employer] messaging system.

## 2021-05-17 ENCOUNTER — Ambulatory Visit (HOSPITAL_COMMUNITY)
Admission: EM | Admit: 2021-05-17 | Discharge: 2021-05-17 | Disposition: A | Discharge status: Home / Self Care | Payer: Self-pay | Attending: Emergency Medicine | Admitting: Emergency Medicine

## 2021-05-17 ENCOUNTER — Encounter (HOSPITAL_COMMUNITY): Payer: Self-pay

## 2021-05-17 ENCOUNTER — Other Ambulatory Visit: Payer: Self-pay

## 2021-05-17 DIAGNOSIS — M542 Cervicalgia: Secondary | ICD-10-CM

## 2021-05-17 MED ORDER — PREDNISONE 20 MG PO TABS: 40.0000 mg | ORAL_TABLET | Freq: Every day | ORAL | 0 refills | Status: AC

## 2021-05-17 MED ORDER — MELOXICAM 7.5 MG PO TABS: 7.5000 mg | ORAL_TABLET | Freq: Every day | ORAL | 0 refills | Status: DC

## 2021-05-17 NOTE — ED Triage Notes (Signed)
Pt presents with R shoulder pain. States he feels he has a pinched nerve. States his hands feel numb.

## 2021-05-17 NOTE — ED Provider Notes (Signed)
[facility]    CSN: [id number] Arrival date & time: 05/17/21  1103      History   Chief Complaint Chief Complaint  Patient presents with   Shoulder Pain    HPI Robert Koch is a 35 y.o. male.   Patient presents with right arm pain radiating down into hand causing numbness and tingling for 1 month.  Endorses symptoms are constant.  Range of motion is limited.  Has attempted treatment of over-the-counter medications.  Endorses that he works at [employer].  Denies prior injury or trauma.  History of atrial fibrillation, TIA.   Past Medical History:  Diagnosis Date   Concealed accessory pathway    PAF (paroxysmal atrial fibrillation) (HCC)    TIA (transient ischemic attack)     Patient Active Problem List   Diagnosis Date Noted   MITRAL VALVE PROLAPSE 09/07/2009   ATRIAL FIBRILLATION 09/07/2009    Past Surgical History:  Procedure Laterality Date   CARDIAC SURGERY  2010   reports extra tissue around heart/Catherization       Home Medications    Prior to Admission medications   Medication Sig Start Date End Date Taking? Authorizing Provider  meloxicam (MOBIC) 7.5 MG tablet Take 1 tablet (7.5 mg total) by mouth daily. 05/17/21  Yes Brendan Gruwell R, NP  predniSONE (DELTASONE) 20 MG tablet Take 2 tablets (40 mg total) by mouth daily. 05/17/21  Yes Stanton Kissoon R, NP  Aspirin-Salicylamide-Caffeine (BC HEADACHE POWDER PO) Take 2 packets by mouth every 6 (six) hours as needed (for pain).    [provider]  diltiazem (CARDIZEM CD) 120 MG 24 hr capsule Take 1 capsule (120 mg total) by mouth daily. 09/16/19   Camnitz, Andree Coss, MD  flecainide (TAMBOCOR) 50 MG tablet Take 1 tablet (50 mg total) by mouth 2 (two) times daily. 09/16/19   Camnitz, Andree Coss, MD    Family History Family History  Problem Relation Age of Onset   Hypertension Mother    Atrial fibrillation Mother    Hypertension Father    Diabetes Father    Sickle cell anemia  Maternal Grandmother    COPD Maternal Grandmother    Heart disease Maternal Grandmother    Hypertension Maternal Grandmother     Social History Social History   Tobacco Use   Smoking status: Former    Packs/day: 0.10    Years: 1.00    Pack years: 0.10    Types: Cigarettes   Smokeless tobacco: Never  Substance Use Topics   Alcohol use: Yes    Comment: occassionally   Drug use: No     Allergies   Amoxicillin   Review of Systems Review of Systems  Constitutional: Negative.   Respiratory: Negative.    Cardiovascular: Negative.   Musculoskeletal:  Positive for myalgias. Negative for arthralgias, back pain, gait problem, joint swelling, neck pain and neck stiffness.  Skin: Negative.   Neurological: Negative.     Physical Exam Triage Vital Signs ED Triage Vitals  Enc Vitals Group     BP 05/17/21 1230 119/83     Pulse Rate 05/17/21 1230 87     Resp 05/17/21 1230 18     Temp 05/17/21 1230 98.3 F (36.8 C)     Temp Source 05/17/21 1230 Oral     SpO2 05/17/21 1230 97 %     Weight --      Height --      Head Circumference --      Peak  Flow --      Pain Score 05/17/21 1229 8     Pain Loc --      Pain Edu? --      Excl. in GC? --    No data found.  Updated Vital Signs BP 119/83 (BP Location: Right Arm)   Pulse 87   Temp 98.3 F (36.8 C) (Oral)   Resp 18   SpO2 97%   Visual Acuity Right Eye Distance:   Left Eye Distance:   Bilateral Distance:    Right Eye Near:   Left Eye Near:    Bilateral Near:     Physical Exam Constitutional:      Appearance: Normal appearance. He is normal weight.  HENT:     Head: Normocephalic.  Eyes:     Extraocular Movements: Extraocular movements intact.  Neck:     Comments: Tenderness over the right lateral aspect of neck and right trapezius muscle, range of motion of the shoulder is intact, no crepitus, spasms, edema or erythema noted Pulmonary:     Effort: Pulmonary effort is normal.  Skin:    General: Skin is warm  and dry.  Neurological:     Mental Status: He is alert and oriented to person, place, and time. Mental status is at baseline.  Psychiatric:        Mood and Affect: Mood normal.        Behavior: Behavior normal.     UC Treatments / Results  Labs (all labs ordered are listed, but only abnormal results are displayed) Labs Reviewed - No data to display  EKG   Radiology No results found.  Procedures Procedures (including critical care time)  Medications Ordered in UC Medications - No data to display  Initial Impression / Assessment and Plan / UC Course  I have reviewed the triage vital signs and the nursing notes.  Pertinent labs & imaging results that were available during my care of the patient were reviewed by me and considered in my medical decision making (see chart for details).  Acute neck pain  Will defer imaging at this time due to lack of injury, patient in agreement with plan of care  Declined in office treatment today Prednisone 40 mg daily with food, advised patient to begin medication tomorrow morning Meloxicam 7.5 mg daily as needed Recommended heating pad in 15-minute intervals, daily stretching and pillows for support 5.  Orthopedic follow-up as needed for reevaluation for persistent or reoccurring symptoms Final Clinical Impressions(s) / UC Diagnoses   Final diagnoses:  Acute neck pain     Discharge Instructions      Your pain is most likely caused by irritation to the muscles or ligaments.   Take 2 meloxicam pills when you get home today   Take prednisone every morning with food for 5 days, while using avoid ibuprofen, advil, aleve and naproxen, may take during day if needing extra comfort   You may use heating pad in 15 minute intervals as needed for additional comfort, within the first 2-3 days you may find comfort in using ice in 10-15 minutes over affected area  Begin stretching affected area daily for 10 minutes as tolerated to further  loosen muscles   When lying down place pillow underneath and between knees for support  Can try sleeping without pillow on firm mattress   Practice good posture: head back, shoulders back, chest forward, pelvis back and weight distributed evenly on both legs  If pain persist after recommended treatment or  reoccurs if may be beneficial to follow up with orthopedic specialist for evaluation, this doctor specializes in the bones and can manage your symptoms long-term with options such as but not limited to imaging, medications or physical therapy      ED Prescriptions     Medication Sig Dispense Auth. Provider   predniSONE (DELTASONE) 20 MG tablet Take 2 tablets (40 mg total) by mouth daily. 10 tablet Kahner Yanik, Hansel Starling R, NP   meloxicam (MOBIC) 7.5 MG tablet Take 1 tablet (7.5 mg total) by mouth daily. 30 tablet Valinda Hoar, NP      PDMP not reviewed this encounter.   Valinda Hoar, NP 05/17/21 [phone]

## 2021-05-17 NOTE — Discharge Instructions (Signed)
Your pain is most likely caused by irritation to the muscles or ligaments.   Take 2 meloxicam pills when you get home today   Take prednisone every morning with food for 5 days, while using avoid ibuprofen, advil, aleve and naproxen, may take during day if needing extra comfort   You may use heating pad in 15 minute intervals as needed for additional comfort, within the first 2-3 days you may find comfort in using ice in 10-15 minutes over affected area  Begin stretching affected area daily for 10 minutes as tolerated to further loosen muscles   When lying down place pillow underneath and between knees for support  Can try sleeping without pillow on firm mattress   Practice good posture: head back, shoulders back, chest forward, pelvis back and weight distributed evenly on both legs  If pain persist after recommended treatment or reoccurs if may be beneficial to follow up with orthopedic specialist for evaluation, this doctor specializes in the bones and can manage your symptoms long-term with options such as but not limited to imaging, medications or physical therapy

## 2022-02-24 ENCOUNTER — Emergency Department (HOSPITAL_COMMUNITY): Payer: Self-pay

## 2022-02-24 ENCOUNTER — Other Ambulatory Visit: Payer: Self-pay

## 2022-02-24 ENCOUNTER — Encounter (HOSPITAL_COMMUNITY): Payer: Self-pay

## 2022-02-24 ENCOUNTER — Emergency Department (HOSPITAL_COMMUNITY)
Admission: EM | Admit: 2022-02-24 | Discharge: 2022-02-25 | Disposition: A | Discharge status: Home / Self Care | Payer: Self-pay | Attending: Emergency Medicine | Admitting: Emergency Medicine

## 2022-02-24 DIAGNOSIS — S00502A Unspecified superficial injury of oral cavity, initial encounter: Secondary | ICD-10-CM | POA: Diagnosis not present

## 2022-02-24 DIAGNOSIS — S89391A Other physeal fracture of lower end of right fibula, initial encounter for closed fracture: Secondary | ICD-10-CM | POA: Diagnosis not present

## 2022-02-24 DIAGNOSIS — Z23 Encounter for immunization: Secondary | ICD-10-CM | POA: Insufficient documentation

## 2022-02-24 DIAGNOSIS — Z7982 Long term (current) use of aspirin: Secondary | ICD-10-CM | POA: Diagnosis not present

## 2022-02-24 DIAGNOSIS — S0993XA Unspecified injury of face, initial encounter: Secondary | ICD-10-CM

## 2022-02-24 DIAGNOSIS — S8991XA Unspecified injury of right lower leg, initial encounter: Secondary | ICD-10-CM | POA: Diagnosis present

## 2022-02-24 DIAGNOSIS — S82831A Other fracture of upper and lower end of right fibula, initial encounter for closed fracture: Secondary | ICD-10-CM

## 2022-02-24 LAB — PROTIME-INR
INR: 1.1 (ref 0.8–1.2)
Prothrombin Time: 13.6 seconds (ref 11.4–15.2)

## 2022-02-24 LAB — I-STAT CHEM 8, ED
BUN: 12 mg/dL (ref 6–20)
Calcium, Ion: 1.04 mmol/L — ABNORMAL LOW (ref 1.15–1.40)
Chloride: 106 mmol/L (ref 98–111)
Creatinine, Ser: 1.4 mg/dL — ABNORMAL HIGH (ref 0.61–1.24)
Glucose, Bld: 82 mg/dL (ref 70–99)
HCT: 41 % (ref 39.0–52.0)
Hemoglobin: 13.9 g/dL (ref 13.0–17.0)
Potassium: 3.3 mmol/L — ABNORMAL LOW (ref 3.5–5.1)
Sodium: 144 mmol/L (ref 135–145)
TCO2: 24 mmol/L (ref 22–32)

## 2022-02-24 LAB — CBC
HCT: 41.1 % (ref 39.0–52.0)
Hemoglobin: 13.7 g/dL (ref 13.0–17.0)
MCH: 28.1 pg (ref 26.0–34.0)
MCHC: 33.3 g/dL (ref 30.0–36.0)
MCV: 84.4 fL (ref 80.0–100.0)
Platelets: 208 10*3/uL (ref 150–400)
RBC: 4.87 MIL/uL (ref 4.22–5.81)
RDW: 13.9 % (ref 11.5–15.5)
WBC: 9.2 10*3/uL (ref 4.0–10.5)
nRBC: 0 % (ref 0.0–0.2)

## 2022-02-24 LAB — COMPREHENSIVE METABOLIC PANEL
ALT: 20 U/L (ref 0–44)
AST: 31 U/L (ref 15–41)
Albumin: 3.9 g/dL (ref 3.5–5.0)
Alkaline Phosphatase: 63 U/L (ref 38–126)
Anion gap: 10 (ref 5–15)
BUN: 9 mg/dL (ref 6–20)
CO2: 25 mmol/L (ref 22–32)
Calcium: 8.5 mg/dL — ABNORMAL LOW (ref 8.9–10.3)
Chloride: 106 mmol/L (ref 98–111)
Creatinine, Ser: 1.19 mg/dL (ref 0.61–1.24)
GFR, Estimated: >60 mL/min (ref >60)
Glucose, Bld: 88 mg/dL (ref 70–99)
Potassium: 3.3 mmol/L — ABNORMAL LOW (ref 3.5–5.1)
Sodium: 141 mmol/L (ref 135–145)
Total Bilirubin: 0.4 mg/dL (ref 0.3–1.2)
Total Protein: 7.1 g/dL (ref 6.5–8.1)

## 2022-02-24 LAB — LACTIC ACID, PLASMA: Lactic Acid, Venous: 2.1 mmol/L (ref 0.5–1.9)

## 2022-02-24 LAB — SAMPLE TO BLOOD BANK

## 2022-02-24 LAB — ETHANOL: Alcohol, Ethyl (B): 241 mg/dL — ABNORMAL HIGH (ref <10)

## 2022-02-24 MED ORDER — BACITRACIN ZINC 500 UNIT/GM EX OINT
TOPICAL_OINTMENT | CUTANEOUS | Status: DC | PRN
  Administered 2022-02-25: 5 via TOPICAL
  Filled 2022-02-24: qty 4.5

## 2022-02-24 MED ORDER — OXYCODONE-ACETAMINOPHEN 5-325 MG PO TABS: 1.0000 | ORAL_TABLET | Freq: Four times a day (QID) | ORAL | 0 refills | Status: DC | PRN

## 2022-02-24 MED ORDER — LACTATED RINGERS BOLUS PEDS
1000.0000 mL | Freq: Once | INTRAVENOUS | Status: AC
  Administered 2022-02-24: 1000 mL via INTRAVENOUS

## 2022-02-24 MED ORDER — IOHEXOL 300 MG/ML  SOLN
100.0000 mL | Freq: Once | INTRAMUSCULAR | Status: AC | PRN
  Administered 2022-02-24: 100 mL via INTRAVENOUS

## 2022-02-24 MED ORDER — TETANUS-DIPHTH-ACELL PERTUSSIS 5-2.5-18.5 LF-MCG/0.5 IM SUSY
0.5000 mL | PREFILLED_SYRINGE | Freq: Once | INTRAMUSCULAR | Status: AC
Start: 2022-02-24 — End: 2022-02-24
  Administered 2022-02-24: 0.5 mL via INTRAMUSCULAR
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
## 2022-02-25 MED ORDER — OXYCODONE HCL 5 MG PO TABS
5.0000 mg | ORAL_TABLET | Freq: Once | ORAL | Status: AC
  Administered 2022-02-25: 5 mg via ORAL
  Filled 2022-02-25: qty 1

## 2022-02-25 MED ORDER — ACETAMINOPHEN 500 MG PO TABS
1000.0000 mg | ORAL_TABLET | Freq: Once | ORAL | Status: AC
  Administered 2022-02-25: 1000 mg via ORAL
  Filled 2022-02-25: qty 2

## 2022-02-25 NOTE — Progress Notes (Signed)
Orthopedic Tech Progress Note Patient Details:  Robert Koch 1985/09/05 [id number]  Ortho Devices Type of Ortho Device: Stirrup splint, Short leg splint Ortho Device/Splint Location: rle Ortho Device/Splint Interventions: Ordered, Application, Adjustment   Post Interventions Patient Tolerated: Well  Robert Koch Robert Koch 02/25/2022, 1:28 AM

## 2022-03-29 ENCOUNTER — Ambulatory Visit (HOSPITAL_COMMUNITY)
Admission: EM | Admit: 2022-03-29 | Discharge: 2022-03-29 | Disposition: A | Discharge status: Home / Self Care | Payer: Self-pay | Attending: Family Medicine | Admitting: Family Medicine

## 2022-03-29 ENCOUNTER — Ambulatory Visit (INDEPENDENT_AMBULATORY_CARE_PROVIDER_SITE_OTHER): Payer: Self-pay

## 2022-03-29 ENCOUNTER — Encounter (HOSPITAL_COMMUNITY): Payer: Self-pay

## 2022-03-29 DIAGNOSIS — S82831A Other fracture of upper and lower end of right fibula, initial encounter for closed fracture: Secondary | ICD-10-CM

## 2022-03-29 DIAGNOSIS — M79661 Pain in right lower leg: Secondary | ICD-10-CM

## 2022-03-29 MED ORDER — OXYCODONE-ACETAMINOPHEN 5-325 MG PO TABS: 1.0000 | ORAL_TABLET | Freq: Four times a day (QID) | ORAL | 0 refills | Status: DC | PRN
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
## 2022-03-29 NOTE — Discharge Instructions (Signed)

## 2022-05-03 ENCOUNTER — Other Ambulatory Visit: Payer: Self-pay

## 2022-05-03 ENCOUNTER — Emergency Department (HOSPITAL_COMMUNITY): Payer: Medicaid Other

## 2022-05-03 ENCOUNTER — Encounter (HOSPITAL_COMMUNITY): Payer: Self-pay

## 2022-05-03 ENCOUNTER — Emergency Department (HOSPITAL_COMMUNITY)
Admission: EM | Admit: 2022-05-03 | Discharge: 2022-05-03 | Disposition: A | Discharge status: Home / Self Care | Payer: Self-pay | Attending: Emergency Medicine | Admitting: Emergency Medicine

## 2022-05-03 DIAGNOSIS — W19XXXA Unspecified fall, initial encounter: Secondary | ICD-10-CM | POA: Insufficient documentation

## 2022-05-03 DIAGNOSIS — S82831A Other fracture of upper and lower end of right fibula, initial encounter for closed fracture: Secondary | ICD-10-CM | POA: Insufficient documentation

## 2022-05-03 MED ORDER — OXYCODONE-ACETAMINOPHEN 5-325 MG PO TABS: 1.0000 | ORAL_TABLET | Freq: Four times a day (QID) | ORAL | 0 refills | Status: AC | PRN

## 2022-05-03 MED ORDER — ONDANSETRON 4 MG PO TBDP
8.0000 mg | ORAL_TABLET | Freq: Once | ORAL | Status: AC
Start: 2022-05-03 — End: 2022-05-03
  Administered 2022-05-03: 8 mg via ORAL
  Filled 2022-05-03: qty 2

## 2022-05-03 MED ORDER — OXYCODONE-ACETAMINOPHEN 5-325 MG PO TABS
1.0000 | ORAL_TABLET | Freq: Once | ORAL | Status: AC
  Administered 2022-05-03: 1 via ORAL
  Filled 2022-05-03: qty 1

## 2022-05-03 NOTE — Discharge Instructions (Addendum)
You are seen today in the emergency department for ankle pain.  You have 2 fractures as we discussed, you will need to follow-up with orthopedics about your surgery.  Please call the number above to schedule follow-up in the office in the end of this week or next week.  Do not walk on your foot, you need to wear the splint and not bear weight directly on until cleared by orthopedic surgery.  Return to the ED if you have new concerning symptoms.  CLINICAL DATA:  Fracture, new fall.    EXAM:  RIGHT ANKLE - COMPLETE 3+ VIEW    COMPARISON:  Prior radiograph dated March 29, 2022 and February 24, 2022.    FINDINGS:  There is a oblique fracture through the distal osseous fragment of  the fibula suspicious for a new fracture.    There is subacute displaced fracture of the distal fibula with  callus formation. There is approximately half shaft width lateral  displacement of the distal fibula.    The medial ankle clear space measures approximately 6 mm suggesting  lateral subluxation at the tibiotalar joint concerning for deltoid  ligament instability/injury. Soft tissue swelling about the ankle.    IMPRESSION:  1. Oblique fracture through the distal fibula suspicious for a new  fracture.  2. Subacute displaced fracture of the distal fibula with callus  formation.  3. Lateral subluxation at the tibiotalar joint concerning for  deltoid ligament instability/injury.      Electronically Signed    By: Judye Bos.O.
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
## 2022-05-03 NOTE — ED Provider Triage Note (Signed)
Emergency Medicine Provider Triage Evaluation Note  Robert Koch , a 36 y.o. male  was evaluated in triage.  Pt complains of lateral right ankle pain.  Patient has known fracture of the distal fibula he had a fall try to put on his walking boot yesterday.  He has increased pain since that time.  Denies any other injuries..  Review of Systems  Positive: Lateral right ankle pain Negative: Numbness, tingling or any additional concerns  Physical Exam  BP 124/75 (BP Location: Right Arm)   Pulse 82   Temp 98 F (36.7 C) (Oral)   Resp 18   Ht 5\' 1"  (1.549 m)   Wt 64.9 kg   SpO2 98%   BMI 27.02 kg/m  Gen:   Awake, no distress   Resp:  Normal effort  MSK:   Mild swelling over the lateral malleolus.  No skin break.  Motion intact ankle with increased pain.  Motion intact at the knee without pain.  Motor intact of the toes without pain.  Pedal pulses intact.  Sensation intact.  Compartments soft.   Medical Decision Making  Medically screening exam initiated at 9:36 AM.  Appropriate orders placed.  TEJ MURDAUGH was informed that the remainder of the evaluation will be completed by another provider, this initial triage assessment does not replace that evaluation, and the importance of remaining in the ED until their evaluation is complete.    Note: Portions of this report may have been transcribed using voice recognition software. Every effort was made to ensure accuracy; however, inadvertent computerized transcription errors may still be present.    Deliah Boston, [place] 05/03/22 [phone]
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
## 2022-05-26 ENCOUNTER — Emergency Department (HOSPITAL_COMMUNITY)
Admission: EM | Admit: 2022-05-26 | Discharge: 2022-05-26 | Disposition: A | Discharge status: Home / Self Care | Payer: Self-pay | Attending: Emergency Medicine | Admitting: Emergency Medicine

## 2022-05-26 ENCOUNTER — Emergency Department (HOSPITAL_COMMUNITY): Payer: Self-pay

## 2022-05-26 ENCOUNTER — Encounter (HOSPITAL_COMMUNITY): Payer: Self-pay

## 2022-05-26 ENCOUNTER — Other Ambulatory Visit: Payer: Self-pay

## 2022-05-26 DIAGNOSIS — Z7982 Long term (current) use of aspirin: Secondary | ICD-10-CM | POA: Insufficient documentation

## 2022-05-26 DIAGNOSIS — M79671 Pain in right foot: Secondary | ICD-10-CM

## 2022-05-26 DIAGNOSIS — M25571 Pain in right ankle and joints of right foot: Secondary | ICD-10-CM | POA: Insufficient documentation

## 2022-05-26 MED ORDER — NAPROXEN 500 MG PO TABS: 500.0000 mg | ORAL_TABLET | Freq: Two times a day (BID) | ORAL | 0 refills | Status: AC
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
## 2022-05-26 NOTE — ED Provider Notes (Signed)
[facility] EMERGENCY DEPARTMENT Provider Note   CSN: [id number] Arrival date & time: 05/26/22  3825     History  Chief Complaint  Patient presents with   Foot Pain    Robert Koch is a 36 y.o. male.   Foot Pain     Patient with medical history of PAF not anticoagulated, closed fracture of distal fibula right presents today due to continued foot pain.  This has been going on for greater than a month, he denies any new or recent trauma.  States he started having pain in his heel yesterday after elevating it without a pillow underneath it.  Denies any stiffness, numbness, paresthesias.  Home Medications Prior to Admission medications   Medication Sig Start Date End Date Taking? Authorizing Provider  naproxen (NAPROSYN) 500 MG tablet Take 1 tablet (500 mg total) by mouth 2 (two) times daily. 05/26/22  Yes Theron Arista, PA-C  Aspirin-Salicylamide-Caffeine (BC HEADACHE POWDER PO) Take 2 packets by mouth every 6 (six) hours as needed (for pain).    [provider]  diltiazem (CARDIZEM CD) 120 MG 24 hr capsule Take 1 capsule (120 mg total) by mouth daily. 09/16/19   Camnitz, Andree Coss, MD  flecainide (TAMBOCOR) 50 MG tablet Take 1 tablet (50 mg total) by mouth 2 (two) times daily. 09/16/19   Camnitz, Andree Coss, MD  oxyCODONE-acetaminophen (PERCOCET/ROXICET) 5-325 MG tablet Take 1 tablet by mouth every 6 (six) hours as needed for severe pain. 05/03/22   Theron Arista, PA-C  predniSONE (DELTASONE) 20 MG tablet Take 2 tablets (40 mg total) by mouth daily. 05/17/21   Valinda Hoar, NP      Allergies    Amoxicillin    Review of Systems   Review of Systems  Physical Exam Updated Vital Signs BP 120/83 (BP Location: Right Arm)   Pulse 78   Temp (!) 97.5 F (36.4 C) (Oral)   Resp 18   Ht 5\' 1"  (1.549 m)   Wt 63.5 kg   SpO2 100%   BMI 26.45 kg/m  Physical Exam Vitals and nursing note reviewed. Exam conducted with a chaperone present.   Constitutional:      Appearance: Normal appearance.  HENT:     Head: Normocephalic and atraumatic.  Eyes:     General: No scleral icterus.       Right eye: No discharge.        Left eye: No discharge.     Extraocular Movements: Extraocular movements intact.     Pupils: Pupils are equal, round, and reactive to light.  Cardiovascular:     Rate and Rhythm: Normal rate and regular rhythm.     Pulses: Normal pulses.     Heart sounds: Normal heart sounds. No murmur heard.    No friction rub. No gallop.  Pulmonary:     Effort: Pulmonary effort is normal. No respiratory distress.     Breath sounds: Normal breath sounds.  Abdominal:     General: Abdomen is flat. Bowel sounds are normal. There is no distension.     Palpations: Abdomen is soft.     Tenderness: There is no abdominal tenderness.  Musculoskeletal:        General: Tenderness present.     Comments: Tenderness over the calcaneus.  No crepitus, tolerates passive ROM to ankle, knee, toes.  Skin:    General: Skin is warm and dry.     Capillary Refill: Capillary refill takes less than 2 seconds.  Coloration: Skin is not jaundiced.  Neurological:     Mental Status: He is alert. Mental status is at baseline.     Coordination: Coordination normal.     ED Results / Procedures / Treatments   Labs (all labs ordered are listed, but only abnormal results are displayed) Labs Reviewed - No data to display  EKG None  Radiology DG Foot Complete Right  Result Date: 05/26/2022 CLINICAL DATA:  Pain EXAM: RIGHT FOOT COMPLETE - 3 VIEW; RIGHT ANKLE - COMPLETE 3 VIEW COMPARISON:  Ankle radiograph dated May 03, 2022 FINDINGS: Fracture of the distal fibula with callus formation, similar to prior exam. Additional oblique fracture of the distal fibula again seen with evidence of interval healing. Unchanged widening of the medial malleolus, measuring 6 mm. No evidence of fracture. Ankle soft tissue edema, similar to prior. IMPRESSION: 1.  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 2. Oblique fracture of the distal fibula which was possibly new on prior exam is less apparent, likely due to interval healing. Electronically Signed   By: Allegra Lai M.D.   On: 05/26/2022 10:15   DG Ankle Complete Right  Result Date: 05/26/2022 CLINICAL DATA:  Pain EXAM: RIGHT FOOT COMPLETE - 3 VIEW; RIGHT ANKLE - COMPLETE 3 VIEW COMPARISON:  Ankle radiograph dated May 03, 2022 FINDINGS: Fracture of the distal fibula with callus formation, similar to prior exam. Additional oblique fracture of the distal fibula again seen with evidence of interval healing. Unchanged widening of the medial malleolus, measuring 6 mm. No evidence of fracture. Ankle soft tissue edema, similar to prior. IMPRESSION: 1. 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 2. Oblique fracture of the distal fibula which was possibly new on prior exam is less apparent, likely due to interval healing. Electronically Signed   By: Allegra Lai M.D.   On: 05/26/2022 10:15    Procedures Procedures    Medications Ordered in ED Medications - No data to display  ED Course/ Medical Decision Making/ A&P                           Medical Decision Making Amount and/or Complexity of Data Reviewed Radiology: ordered.  Risk Prescription drug management.   Patient presents due to continued right foot and ankle pain.  On exam his DP and PT are 2+.  Cap refills less than 2, tolerates passive ROM.  Compartments are soft.  I ordered imaging studies, I viewed the imaging and agree with radiologist of no acute fractures.  Patient has healing right distal fibula fracture unchanged compared to previous.  He is a chronic fracture dislocation of the right ankle which is also unchanged.  I do not think any additional work-up is indicated.  Patient is still wearing his cam  walking boot for support which I encouraged to continue.  Patient not followed up with orthopedics, have encouraged him to call them and schedule follow-up.  Return precautions were discussed.  No signs of ischemic limb, septic joint, compartment syndrome.  Suspect acute on chronic pain from the fracture.  Patient is stable and appropriate for outpatient follow-up with orthopedics at this point.        Final Clinical Impression(s) / ED Diagnoses Final diagnoses:  Foot pain, right    Rx / DC Orders ED Discharge Orders          Ordered  naproxen (NAPROSYN) 500 MG tablet  2 times daily        05/26/22 1154              Sherrill Raring, [place] 05/26/22 1213    Valarie Merino, MD 05/28/22 1302

## 2022-05-26 NOTE — ED Provider Triage Note (Signed)
Emergency Medicine Provider Triage Evaluation Note  Robert Koch , a 36 y.o. male  was evaluated in triage.  Pt complains of pain in his heel distal fibula.  Bilateral since yesterday.  No new injuries.  Has not been able to follow-up with orthopedics due to insurance issue..  Review of Systems  Per HPI  Physical Exam  BP 132/86 (BP Location: Right Arm)   Pulse 81   Temp 97.6 F (36.4 C)   Resp 16   Ht 5\' 1"  (1.549 m)   Wt 63.5 kg   SpO2 99%   BMI 26.45 kg/m  Gen:   Awake, no distress   Resp:  Normal effort  MSK:   Moves extremities without difficulty  Other:  Compartments are soft, cap refill less than 2.  Pulses are palpable.  Medical Decision Making  Medically screening exam initiated at 9:41 AM.  Appropriate orders placed.  WILLIOM CEDAR was informed that the remainder of the evaluation will be completed by another provider, this initial triage assessment does not replace that evaluation, and the importance of remaining in the ED until their evaluation is complete.     Sherrill Raring, [place] 05/26/22 [phone]

## 2022-05-26 NOTE — Discharge Instructions (Addendum)
The fractures are healing as they are supposed to be.  You have no new fractures.  Take the anti-inflammatory with food and water twice daily.  Follow-up with orthopedics, call above number to schedule an appointment.  Return to the ED for new or concerning symptoms.

## 2022-07-31 ENCOUNTER — Ambulatory Visit (HOSPITAL_COMMUNITY)
Admission: EM | Admit: 2022-07-31 | Discharge: 2022-07-31 | Disposition: A | Discharge status: Home / Self Care | Payer: Medicaid Other | Attending: Family Medicine | Admitting: Family Medicine

## 2022-07-31 ENCOUNTER — Ambulatory Visit (INDEPENDENT_AMBULATORY_CARE_PROVIDER_SITE_OTHER): Payer: Medicaid Other

## 2022-07-31 ENCOUNTER — Other Ambulatory Visit: Payer: Self-pay

## 2022-07-31 ENCOUNTER — Encounter (HOSPITAL_COMMUNITY): Payer: Self-pay | Admitting: *Deleted

## 2022-07-31 DIAGNOSIS — Z8781 Personal history of (healed) traumatic fracture: Secondary | ICD-10-CM

## 2022-07-31 DIAGNOSIS — M25571 Pain in right ankle and joints of right foot: Secondary | ICD-10-CM | POA: Diagnosis not present

## 2022-07-31 DIAGNOSIS — G8929 Other chronic pain: Secondary | ICD-10-CM
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
## 2022-07-31 NOTE — Discharge Instructions (Addendum)
You were seen today for continued right ankle pain after fractures several months ago.   Your xray does not show anything new, but healing from previous injury.  I have given you another CAM boot today for comfort.  You may take tylenol/motrin for pain.  You need to follow up with an orthopedist at this time.  There is nothing else the ER or urgent care can do at this time.  Please call  Specialty: Orthopedic Surgery Contact information: [address] [place] [address] [phone]

## 2022-12-14 ENCOUNTER — Ambulatory Visit: Payer: Medicaid Other

## 2022-12-14 NOTE — Progress Notes (Deleted)
CC: new patient visit  HPI:  [name] is a 37 y.o. male with past medical history of TIA, paroxysmal a-fib, MVP, *** that presents for new patient visit.    Past medical history includes: Patient denies past medical history of HTN, DM, CHF, MI, DVT, stroke, or cancer.   Family history includes: Patient denies family history of HTN, DM, CHF, MI, DVT, stroke, or cancer.   Past surgical history:  Past social history: Patient lives at home with ***. Patient works as a ***. Patient denies current or prior tobacco use, alcohol use, or recreational drug use.   Medications:   Allergies as of 12/14/2022       Reactions   Amoxicillin Other (See Comments)   Childhood         Medication List        Accurate as of Dec 14, 2022  7:06 AM. If you have any questions, ask your nurse or doctor.          BC HEADACHE POWDER PO Take 2 packets by mouth every 6 (six) hours as needed (for pain).   diltiazem 120 MG 24 hr capsule Commonly known as: CARDIZEM CD Take 1 capsule (120 mg total) by mouth daily.   flecainide 50 MG tablet Commonly known as: TAMBOCOR Take 1 tablet (50 mg total) by mouth 2 (two) times daily.   naproxen 500 MG tablet Commonly known as: NAPROSYN Take 1 tablet (500 mg total) by mouth 2 (two) times daily.   oxyCODONE-acetaminophen 5-325 MG tablet Commonly known as: PERCOCET/ROXICET Take 1 tablet by mouth every 6 (six) hours as needed for severe pain.   predniSONE 20 MG tablet Commonly known as: DELTASONE Take 2 tablets (40 mg total) by mouth daily.         Past Medical History:  Diagnosis Date   Concealed accessory pathway    PAF (paroxysmal atrial fibrillation) (HCC)    TIA (transient ischemic attack)    Review of Systems:  per HPI.   Physical Exam: *** There were no vitals filed for this visit. *** Constitutional: Well-developed, well-nourished, appears comfortable  HENT: Normocephalic and atraumatic.  Eyes: EOM are normal.  PERRL.  Neck: Normal range of motion.  Cardiovascular: Regular rate, regular rhythm. No murmurs, rubs, or gallops. Normal radial and PT pulses bilaterally. No LE edema.  Pulmonary: Normal respiratory effort. No wheezes, rales, or rhonchi.   Abdominal: Soft. Non-distended. No tenderness. Normal bowel sounds.  Musculoskeletal: Normal range of motion.     Neurological: Alert and oriented to person, place, and time. Non-focal. Skin: warm and dry.    Assessment & Plan:   A-fib Patient has a history of paroxysmal a-fib. Was last seen by cardiology in 08/2019 where it was noted he was originally diagnosed in 2011. 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 Ablation was attempted in 08/2009, however, this was unsuccessful. Patient was started on diltiazem 120 MG daily and flecainide 50 MG BID. Patient reports that they are *** compliant with this medication. Patient denies chest pain, palpitations, SOB, orthopnea, LE swelling, syncope***. CHADSVASC score is ***.   Plan: - Continue diltiazem 120 MG daily and flecainide 50 MG BID*** - Referral to cardiology?***   2.   Plan: - Continue   3.   Plan: - Continue   4.***   Plan: - Continue   5. Health Screening: *** Hep C, lipid, A1c -  - Medication refill? ***  -   See Encounters Tab for problem based  charting.  No problem-specific Assessment & Plan notes found for this encounter.    Patient seen with Dr. Amadeo Garnet
